# Patient Record
Sex: Male | Born: 1980 | Race: White | Hispanic: No | Marital: Married | Smoking: Former smoker
Health system: Southern US, Community
[De-identification: ages and names within clinical notes are randomized; demographics above are authoritative.]

## PROBLEM LIST (undated history)

## (undated) DIAGNOSIS — R011 Cardiac murmur, unspecified: Secondary | ICD-10-CM

## (undated) DIAGNOSIS — F1910 Other psychoactive substance abuse, uncomplicated: Secondary | ICD-10-CM

## (undated) DIAGNOSIS — F419 Anxiety disorder, unspecified: Secondary | ICD-10-CM

## (undated) HISTORY — DX: Other psychoactive substance abuse, uncomplicated: F19.10

## (undated) HISTORY — DX: Anxiety disorder, unspecified: F41.9

## (undated) HISTORY — DX: Cardiac murmur, unspecified: R01.1

## (undated) HISTORY — PX: FRACTURE SURGERY: SHX138

---

## 2004-05-12 ENCOUNTER — Emergency Department: Payer: Self-pay | Admitting: Emergency Medicine

## 2007-07-05 ENCOUNTER — Emergency Department: Payer: Self-pay | Admitting: Emergency Medicine

## 2008-12-08 ENCOUNTER — Emergency Department: Payer: Self-pay | Admitting: Emergency Medicine

## 2009-01-19 ENCOUNTER — Emergency Department: Payer: Self-pay | Admitting: Unknown Physician Specialty

## 2010-07-28 ENCOUNTER — Emergency Department: Payer: Self-pay | Admitting: Emergency Medicine

## 2011-04-06 ENCOUNTER — Ambulatory Visit: Payer: Self-pay

## 2011-04-06 DIAGNOSIS — F411 Generalized anxiety disorder: Secondary | ICD-10-CM

## 2011-09-10 ENCOUNTER — Other Ambulatory Visit: Payer: Self-pay

## 2011-09-10 NOTE — Telephone Encounter (Addendum)
Dr. Merla Riches   Patient states he has no money for an OV.  Would like a one month extension on celexa and Clonazepam [facility] [address] [facility] [place],  Pt called again today for  Same thing.   Pt called again to change his phone number please contact him at [phone]

## 2011-09-10 NOTE — Telephone Encounter (Signed)
Please pull chart for me to review 

## 2011-09-11 MED ORDER — CLONAZEPAM 1 MG PO TABS: ORAL_TABLET | ORAL | Status: DC

## 2011-09-11 MED ORDER — CITALOPRAM HYDROBROMIDE 40 MG PO TABS: 40.0000 mg | ORAL_TABLET | Freq: Every day | ORAL | Status: DC

## 2011-09-11 NOTE — Telephone Encounter (Signed)
1 month refill okay for her Celexa and Klonopin

## 2011-09-12 NOTE — Telephone Encounter (Signed)
Faxed Rxs to [facility] and notified pt that it was done.

## 2011-10-16 ENCOUNTER — Telehealth: Payer: Self-pay

## 2011-10-16 NOTE — Telephone Encounter (Signed)
Please pull paper chart.  

## 2011-10-16 NOTE — Telephone Encounter (Signed)
PT WOULD LIKE TO HAVE A REFILL ON CELEXA AND CLONAZEPAM/ PT STATES THAT HE IS AWARE THAT HE MAY NEED AN OFFICE VISIT BUT HE DOES NOT HAVE THE MONEY AT THIS TIME TO COME IN FOR AN OFFICE VISIT HE MAY BE ABLE TO COME IN A MONTH.

## 2011-10-17 ENCOUNTER — Other Ambulatory Visit: Payer: Self-pay | Admitting: Internal Medicine

## 2011-10-17 MED ORDER — CITALOPRAM HYDROBROMIDE 40 MG PO TABS: 40.0000 mg | ORAL_TABLET | Freq: Every day | ORAL | Status: DC

## 2011-10-17 MED ORDER — CLONAZEPAM 1 MG PO TABS: ORAL_TABLET | ORAL | Status: DC

## 2011-10-17 NOTE — Telephone Encounter (Signed)
Rx done and ready to be faxed for pharmacy - need to find out which one. Also, he will need an office visit for additional refills, but I gave him 1 month of each.

## 2011-10-17 NOTE — Telephone Encounter (Signed)
Patient's chart is at the nurses station in the phone message pile.  DOS 04/06/11

## 2011-10-17 NOTE — Telephone Encounter (Signed)
[facility] in [place] [facility] [address]. Notified Pt he needs an OV before any more refills. Faxed refills over to [facility] at [phone] [facility]

## 2011-11-06 ENCOUNTER — Ambulatory Visit: Payer: Self-pay

## 2011-11-06 ENCOUNTER — Ambulatory Visit: Payer: Self-pay | Admitting: Internal Medicine

## 2011-11-06 VITALS — BP 112/76 | HR 90 | Temp 97.7°F | Resp 18 | Ht 67.0 in | Wt 155.8 lb

## 2011-11-06 DIAGNOSIS — F329 Major depressive disorder, single episode, unspecified: Secondary | ICD-10-CM | POA: Insufficient documentation

## 2011-11-06 DIAGNOSIS — F1120 Opioid dependence, uncomplicated: Secondary | ICD-10-CM | POA: Insufficient documentation

## 2011-11-06 DIAGNOSIS — G4700 Insomnia, unspecified: Secondary | ICD-10-CM

## 2011-11-06 DIAGNOSIS — G894 Chronic pain syndrome: Secondary | ICD-10-CM

## 2011-11-06 DIAGNOSIS — F17200 Nicotine dependence, unspecified, uncomplicated: Secondary | ICD-10-CM | POA: Insufficient documentation

## 2011-11-06 DIAGNOSIS — F411 Generalized anxiety disorder: Secondary | ICD-10-CM

## 2011-11-06 MED ORDER — TRAZODONE HCL 50 MG PO TABS: 50.0000 mg | ORAL_TABLET | Freq: Every evening | ORAL | Status: DC | PRN

## 2011-11-06 MED ORDER — CLONAZEPAM 1 MG PO TABS: ORAL_TABLET | ORAL | Status: DC

## 2011-11-06 MED ORDER — CITALOPRAM HYDROBROMIDE 40 MG PO TABS: 40.0000 mg | ORAL_TABLET | Freq: Every day | ORAL | Status: DC

## 2011-11-06 NOTE — Progress Notes (Signed)
  Subjective:    Patient ID: Vernon Hudson, male    DOB: [birth year], 31 y.o.   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 Chronic pain syndrome  . Methadone maintenance therapy patient  . Depression  . GAD (generalized anxiety disorder)  . Nicotine addiction    -  Insomnia  Meds ordered this encounter  Medications  . methadone (DOLOPHINE) 10 MG/ML solution    Sig: Take by mouth every 8 (eight) hours.  . clonazePAM (KLONOPIN) 1 MG tablet    Sig: 1 in the morning, one in the afternoon, and 2 at bedtime. Needs office visit    Dispense:  120 tablet    Refill:  5  . citalopram (CELEXA) 40 MG tablet    Sig: Take 1 tablet (40 mg total) by mouth daily. Needs office visit    Dispense:  30 tablet    Refill:  5  . traZODone (DESYREL) 50 MG tablet    Sig: Take 1 tablet (50 mg total) by mouth at bedtime as needed for sleep.    Dispense:  30 tablet    Refill:  5  I explained that he is at his max for Klonopin/he understands the risk of respiratory depression and death from the combination with methadone He feels that anxiety is his biggest problem at this point with withdrawing from alcohol and/or methadone We discussed orthopedic help with chronic pain issues if they emerge as he withdraws from methadone As in the past he understands we will not treat  chronic pain for him Trazodone added to try to decrease his need for benzodiazepines at bedtime and perhaps to influence his chronic pain

## 2011-11-30 ENCOUNTER — Inpatient Hospital Stay: Payer: Self-pay | Admitting: Psychiatry

## 2011-11-30 LAB — URINALYSIS, COMPLETE
Bacteria: NONE SEEN
Ketone: NEGATIVE
Protein: NEGATIVE
RBC,UR: <1 /HPF (ref 0–5)
Specific Gravity: 1.005 (ref 1.003–1.030)
Squamous Epithelial: NONE SEEN
WBC UR: 2 /HPF (ref 0–5)

## 2011-11-30 LAB — CBC
HCT: 47.6 % (ref 40.0–52.0)
HGB: 16.1 g/dL (ref 13.0–18.0)
MCV: 94 fL (ref 80–100)
RBC: 5.07 10*6/uL (ref 4.40–5.90)
RDW: 12.7 % (ref 11.5–14.5)
WBC: 6.9 10*3/uL (ref 3.8–10.6)

## 2011-11-30 LAB — COMPREHENSIVE METABOLIC PANEL
Albumin: 4.1 g/dL (ref 3.4–5.0)
Anion Gap: 10 (ref 7–16)
Calcium, Total: 9.1 mg/dL (ref 8.5–10.1)
Co2: 28 mmol/L (ref 21–32)
Creatinine: 0.91 mg/dL (ref 0.60–1.30)
Glucose: 95 mg/dL (ref 65–99)
Osmolality: 276 (ref 275–301)
Potassium: 3.9 mmol/L (ref 3.5–5.1)
SGOT(AST): 92 U/L — ABNORMAL HIGH (ref 15–37)
Sodium: 138 mmol/L (ref 136–145)

## 2011-11-30 LAB — ETHANOL
Ethanol %: 0.063 % (ref 0.000–0.080)
Ethanol: 63 mg/dL

## 2011-11-30 LAB — DRUG SCREEN, URINE
Amphetamines, Ur Screen: NEGATIVE (ref ?–1000)
Benzodiazepine, Ur Scrn: NEGATIVE (ref ?–200)
MDMA (Ecstasy)Ur Screen: NEGATIVE (ref ?–500)
Phencyclidine (PCP) Ur S: NEGATIVE (ref ?–25)
Tricyclic, Ur Screen: NEGATIVE (ref ?–1000)

## 2011-11-30 LAB — ACETAMINOPHEN LEVEL: Acetaminophen: <2 ug/mL

## 2011-11-30 LAB — SALICYLATE LEVEL: Salicylates, Serum: 1.9 mg/dL

## 2012-02-09 ENCOUNTER — Telehealth: Payer: Self-pay

## 2012-02-09 NOTE — Telephone Encounter (Signed)
[facility]ed [facility] [address] and they had a RX on file and will fill for him. patient notified and voiced understanding.

## 2012-02-09 NOTE — Telephone Encounter (Signed)
Pt states that his pharmacy is stating that his prescription for celexa has no more refills and he is not due for a OV until January. Please Advise. [facility] [address] [phone] Best# [phone]

## 2012-02-09 NOTE — Telephone Encounter (Signed)
Please call the pharmacy.  When the patient was seen 7/30, he was given this medication with refills x 5.  He shouldn't be out until the end of January 2014.

## 2012-03-31 ENCOUNTER — Emergency Department: Payer: Self-pay | Admitting: Emergency Medicine

## 2012-03-31 LAB — COMPREHENSIVE METABOLIC PANEL
Albumin: 4.2 g/dL (ref 3.4–5.0)
Alkaline Phosphatase: 94 U/L (ref 50–136)
Anion Gap: 5 — ABNORMAL LOW (ref 7–16)
Calcium, Total: 9.1 mg/dL (ref 8.5–10.1)
Chloride: 101 mmol/L (ref 98–107)
Creatinine: 1.02 mg/dL (ref 0.60–1.30)
EGFR (African American): >60
Glucose: 107 mg/dL — ABNORMAL HIGH (ref 65–99)
Potassium: 3.8 mmol/L (ref 3.5–5.1)
SGOT(AST): 31 U/L (ref 15–37)
SGPT (ALT): 41 U/L (ref 12–78)

## 2012-03-31 LAB — CBC
HGB: 16.9 g/dL (ref 13.0–18.0)
MCV: 92 fL (ref 80–100)
Platelet: 241 10*3/uL (ref 150–440)
RBC: 5.34 10*6/uL (ref 4.40–5.90)
RDW: 12.8 % (ref 11.5–14.5)
WBC: 8.8 10*3/uL (ref 3.8–10.6)

## 2012-03-31 LAB — TROPONIN I: Troponin-I: <0.02 ng/mL

## 2012-04-09 ENCOUNTER — Ambulatory Visit: Payer: Self-pay | Admitting: Internal Medicine

## 2012-04-09 VITALS — BP 123/78 | HR 93 | Temp 97.9°F | Resp 17 | Ht 66.5 in | Wt 153.0 lb

## 2012-04-09 DIAGNOSIS — F17200 Nicotine dependence, unspecified, uncomplicated: Secondary | ICD-10-CM

## 2012-04-09 DIAGNOSIS — F329 Major depressive disorder, single episode, unspecified: Secondary | ICD-10-CM

## 2012-04-09 DIAGNOSIS — G894 Chronic pain syndrome: Secondary | ICD-10-CM

## 2012-04-09 DIAGNOSIS — F1120 Opioid dependence, uncomplicated: Secondary | ICD-10-CM

## 2012-04-09 DIAGNOSIS — F411 Generalized anxiety disorder: Secondary | ICD-10-CM

## 2012-04-09 MED ORDER — CLONAZEPAM 1 MG PO TABS: ORAL_TABLET | ORAL | Status: DC

## 2012-04-09 MED ORDER — ALPRAZOLAM 1 MG PO TABS: 1.0000 mg | ORAL_TABLET | Freq: Three times a day (TID) | ORAL | Status: AC | PRN

## 2012-04-09 MED ORDER — TRAZODONE HCL 100 MG PO TABS: 100.0000 mg | ORAL_TABLET | Freq: Every evening | ORAL | Status: DC | PRN

## 2012-04-09 MED ORDER — CITALOPRAM HYDROBROMIDE 40 MG PO TABS: 40.0000 mg | ORAL_TABLET | Freq: Every day | ORAL | Status: DC

## 2012-04-09 MED ORDER — HYDROXYZINE HCL 50 MG PO TABS: ORAL_TABLET | ORAL | Status: DC

## 2012-04-09 NOTE — Progress Notes (Signed)
  Subjective:    Patient ID: Nation Cradle, male    DOB: [birth year], 32 y.o.   MRN: [id number]  HPI recent anxiety increase [facility] 2 weeks ago-chest pain Dx-stress!!! 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 GAD (generalized anxiety disorder)   2. Methadone maintenance therapy patient   3. Depression   4. Chronic pain syndrome   5. Nicotine addiction   Continue Celexa/increase trazodone 200 at bedtime/LL 1 week of Xanax then resume Klonopin May add Vistaril at panic attack Continue followup with methadone maintenance Refer to psychology-[facility] [place] Recheck 6mos Meds ordered this encounter  Medications  . clonazePAM (KLONOPIN) 1 MG tablet    Sig: 1 in the morning, one in the afternoon, and 2 at bedtime.    Dispense:  120 tablet    Refill:  5  . citalopram (CELEXA) 40 MG tablet    Sig: Take 1 tablet (40 mg total) by mouth daily.    Dispense:  90 tablet    Refill:  1  . hydrOXYzine (ATARAX/VISTARIL) 50 MG tablet    Sig: One at onset at panic attack prn    Dispense:  30 tablet    Refill:  0  . traZODone (DESYREL) 100 MG tablet    Sig: Take 1 tablet (100 mg total) by mouth at bedtime as needed for sleep.    Dispense:  30 tablet    Refill:  5  . ALPRAZolam (XANAX) 1 MG tablet    Sig: Take 1 tablet (1 mg total) by mouth 3 (three) times daily as needed for sleep.    Dispense:  20 tablet    Refill:  0

## 2012-04-28 ENCOUNTER — Encounter: Payer: Self-pay | Admitting: Internal Medicine

## 2012-06-15 ENCOUNTER — Telehealth: Payer: Self-pay

## 2012-06-15 DIAGNOSIS — F411 Generalized anxiety disorder: Secondary | ICD-10-CM

## 2012-06-15 NOTE — Telephone Encounter (Signed)
Pt would like a refill on hydroxine.  Best# [phone] [facility] [address], [place]

## 2012-06-16 NOTE — Telephone Encounter (Signed)
Please advise on refill of hydroxyzine takes one at onset of panic attack. pended

## 2012-06-17 MED ORDER — HYDROXYZINE HCL 50 MG PO TABS: ORAL_TABLET | ORAL | Status: DC

## 2012-06-17 NOTE — Telephone Encounter (Signed)
Rx sent to pharmacy   

## 2012-07-04 ENCOUNTER — Encounter: Payer: Self-pay | Admitting: Internal Medicine

## 2012-07-07 ENCOUNTER — Telehealth: Payer: Self-pay | Admitting: Radiology

## 2012-07-07 NOTE — Telephone Encounter (Signed)
Faxed continuation of care form to [facility] with OV from 04/09/12 per Dr Merla Riches

## 2012-07-16 ENCOUNTER — Emergency Department: Payer: Self-pay | Admitting: Emergency Medicine

## 2012-09-20 ENCOUNTER — Other Ambulatory Visit: Payer: Self-pay | Admitting: Internal Medicine

## 2012-09-20 NOTE — Telephone Encounter (Signed)
Pt is calling because he is needing possible refills on 2 prescriptions he states he doesn't have the money to come in to be seen Call back number is [phone]

## 2012-09-21 NOTE — Telephone Encounter (Signed)
What about celexa and trazadone

## 2012-09-22 ENCOUNTER — Telehealth: Payer: Self-pay

## 2012-09-22 DIAGNOSIS — F411 Generalized anxiety disorder: Secondary | ICD-10-CM

## 2012-09-22 DIAGNOSIS — F329 Major depressive disorder, single episode, unspecified: Secondary | ICD-10-CM

## 2012-09-22 NOTE — Telephone Encounter (Signed)
Patient calling to speak to Dr. Merla Riches in regards to getting a month supply on his rx's because he cannot come in and afford an office visit.   Month supply on two of his rx's since he does not have any  celexa and klonozapam  [facility] in [place] [address] [phone]  Number: [phone]

## 2012-09-23 MED ORDER — CLONAZEPAM 1 MG PO TABS: ORAL_TABLET | ORAL | Status: DC

## 2012-09-23 MED ORDER — CITALOPRAM HYDROBROMIDE 40 MG PO TABS: 40.0000 mg | ORAL_TABLET | Freq: Every day | ORAL | Status: DC

## 2012-09-23 NOTE — Telephone Encounter (Signed)
Rx sent in

## 2012-09-23 NOTE — Telephone Encounter (Signed)
Meds ordered this encounter  Medications  . clonazePAM (KLONOPIN) 1 MG tablet    Sig: TAKE ONE TABLET BY MOUTH IN THE MORNING AND ONE IN THE AFTERNOON AND TWO AT BEDTIME    Dispense:  120 tablet    Refill:  0  . citalopram (CELEXA) 40 MG tablet    Sig: Take 1 tablet (40 mg total) by mouth daily.    Dispense:  90 tablet    Refill:  1

## 2012-10-17 ENCOUNTER — Ambulatory Visit (INDEPENDENT_AMBULATORY_CARE_PROVIDER_SITE_OTHER): Payer: Self-pay | Admitting: Physician Assistant

## 2012-10-17 VITALS — BP 110/72 | HR 90 | Temp 97.7°F | Resp 18 | Ht 67.5 in | Wt 159.0 lb

## 2012-10-17 DIAGNOSIS — F329 Major depressive disorder, single episode, unspecified: Secondary | ICD-10-CM

## 2012-10-17 DIAGNOSIS — F411 Generalized anxiety disorder: Secondary | ICD-10-CM

## 2012-10-17 DIAGNOSIS — F32A Depression, unspecified: Secondary | ICD-10-CM

## 2012-10-17 MED ORDER — CITALOPRAM HYDROBROMIDE 40 MG PO TABS: 40.0000 mg | ORAL_TABLET | Freq: Every day | ORAL | Status: DC

## 2012-10-17 MED ORDER — CLONAZEPAM 1 MG PO TABS: ORAL_TABLET | ORAL | Status: DC

## 2012-10-17 MED ORDER — HYDROXYZINE HCL 50 MG PO TABS: 50.0000 mg | ORAL_TABLET | Freq: Four times a day (QID) | ORAL | Status: DC | PRN

## 2012-10-17 MED ORDER — TRAZODONE HCL 100 MG PO TABS: 100.0000 mg | ORAL_TABLET | Freq: Every evening | ORAL | Status: DC | PRN

## 2012-10-17 NOTE — Progress Notes (Signed)
   [address] [place] [address]   Phone [phone]  Subjective:    Patient ID: Vernon Hudson, male    DOB: 06/20/1980, 32 y.o.   MRN: [id number]  HPI Pt presents to clinic for med refills.  He took today off to see Dr Merla Riches.  He had good results with the Atarax for the panic attacks but has not had any for about 6 months.  He notices he uses less Klonopin at night (only 1) when he takes the trazodone at bedtime.  He is out of his Celexa and has been using a friends prozac.  He has decreased his methadone to 100mg  and continues to plan to decrease the dosage through the clinic.   Review of Systems     Objective:   Physical Exam        Assessment & Plan:  GAD (generalized anxiety disorder) - Plan: citalopram (CELEXA) 40 MG tablet, clonazePAM (KLONOPIN) 1 MG tablet, hydrOXYzine (ATARAX/VISTARIL) 50 MG tablet, traZODone (DESYREL) 100 MG tablet  Depression - Plan: citalopram (CELEXA) 40 MG tablet, traZODone (DESYREL) 100 MG tablet  I d/w pt our controlled substance policy and gave him 1 month of med except for his Klonopin and I gave him 1 week supply and he will f/u with Dr Merla Riches on Sunday am.  I d/w pt that he should plan to only see Dr Merla Riches for this medication in the future.  Benny Lennert PA-C 10/17/2012 2:32 PM

## 2012-10-19 ENCOUNTER — Ambulatory Visit: Payer: Self-pay | Admitting: Internal Medicine

## 2012-10-19 VITALS — BP 120/80 | HR 69 | Temp 98.0°F | Resp 12 | Ht 67.0 in | Wt 156.0 lb

## 2012-10-19 DIAGNOSIS — F411 Generalized anxiety disorder: Secondary | ICD-10-CM

## 2012-10-19 DIAGNOSIS — F329 Major depressive disorder, single episode, unspecified: Secondary | ICD-10-CM

## 2012-10-19 MED ORDER — CLONAZEPAM 1 MG PO TABS: ORAL_TABLET | ORAL | Status: DC

## 2012-10-19 MED ORDER — TRAZODONE HCL 100 MG PO TABS: 100.0000 mg | ORAL_TABLET | Freq: Every evening | ORAL | Status: DC | PRN

## 2012-10-19 MED ORDER — CITALOPRAM HYDROBROMIDE 40 MG PO TABS: 40.0000 mg | ORAL_TABLET | Freq: Every day | ORAL | Status: DC

## 2012-10-19 MED ORDER — HYDROXYZINE HCL 50 MG PO TABS: 50.0000 mg | ORAL_TABLET | Freq: Four times a day (QID) | ORAL | Status: DC | PRN

## 2012-10-20 NOTE — Progress Notes (Signed)
  Subjective:    Patient ID: Vernon Hudson, male    DOB: 1980-11-22, 32 y.o.   MRN: [id number]  HPIsee last ov--is here for longer term refills Down to 40mg  methadone a day Life more stable Considering chg to subox if insurance from DSS comes thru dper anx stable No subst ab Sleeping well Vistaril not needed often(panic)  Review of Systems     Objective:   Physical Exam BP 120/80  Pulse 69  Temp(Src) 98 F (36.7 C) (Oral)  Resp 12  Ht 5\' 7"  (1.702 m)  Wt 156 lb (70.761 kg)  BMI 24.43 kg/m2 Mood good Affect appr       Assessment & Plan:  GAD (generalized anxiety disorder) - Plan: hydrOXYzine (ATARAX/VISTARIL) 50 MG tablet, clonazePAM (KLONOPIN) 1 MG tablet, citalopram (CELEXA) 40 MG tablet, traZODone (DESYREL) 100 MG tablet  Depression - Plan: citalopram (CELEXA) 40 MG tablet, traZODone (DESYREL) 100 MG tablet  Meds ordered this encounter  Medications  . hydrOXYzine (ATARAX/VISTARIL) 50 MG tablet    Sig: Take 1 tablet (50 mg total) by mouth every 6 (six) hours as needed (anxiety).    Dispense:  120 tablet    Refill:  5    Order Specific Question:  Supervising Provider    Answer:  Keevon Henney P [[id number]]  . clonazePAM (KLONOPIN) 1 MG tablet    Sig: TAKE ONE TABLET BY MOUTH IN THE MORNING AND ONE IN THE AFTERNOON AND TWO AT BEDTIME    Dispense:  120 tablet    Refill:  5    Order Specific Question:  Supervising Provider    Answer:  Dariane Natzke P [[id number]]  . citalopram (CELEXA) 40 MG tablet    Sig: Take 1 tablet (40 mg total) by mouth daily.    Dispense:  30 tablet    Refill:  5    Order Specific Question:  Supervising Provider    Answer:  Zakhia Seres P [[id number]]  . traZODone (DESYREL) 100 MG tablet    Sig: Take 1 tablet (100 mg total) by mouth at bedtime as needed for sleep.    Dispense:  30 tablet    Refill:  5    Order Specific Question:  Supervising Provider    Answer:  Pedram Goodchild P [[id number]]   F/u 6 mos

## 2012-10-24 ENCOUNTER — Emergency Department: Payer: Self-pay | Admitting: Emergency Medicine

## 2012-12-25 ENCOUNTER — Emergency Department: Payer: Self-pay | Admitting: Emergency Medicine

## 2013-01-05 ENCOUNTER — Emergency Department: Payer: Self-pay | Admitting: Emergency Medicine

## 2013-03-03 ENCOUNTER — Other Ambulatory Visit: Payer: Self-pay | Admitting: Internal Medicine

## 2013-03-03 ENCOUNTER — Telehealth: Payer: Self-pay

## 2013-03-03 NOTE — Telephone Encounter (Signed)
Called pharm who reported that the pt has no RFs on file. I gave him 2 RFs w/ note that pt will be due for f/up before they run out. Asked pharm about whether his ins will pay for them yet and was advised that they only cost $4 to pay OOP. Tried to call pt but his VM was full.

## 2013-03-03 NOTE — Telephone Encounter (Signed)
Was medication sent for pt?  Telephone note dated 11/25 sounds like this was sent but I do not see it on the med list as being sent

## 2013-03-03 NOTE — Telephone Encounter (Signed)
Pt states he lost his celexa,has been out of for three days,pt says he is panicky.  Best phone for pt is [phone]  [facility] [address]

## 2013-03-04 NOTE — Telephone Encounter (Signed)
Called pt back and notified pt.

## 2013-04-16 ENCOUNTER — Ambulatory Visit: Payer: Self-pay | Admitting: Emergency Medicine

## 2013-04-16 ENCOUNTER — Other Ambulatory Visit: Payer: Self-pay | Admitting: Internal Medicine

## 2013-04-16 VITALS — BP 120/72 | HR 76 | Temp 98.3°F | Resp 16 | Ht 66.5 in | Wt 154.4 lb

## 2013-04-16 DIAGNOSIS — F411 Generalized anxiety disorder: Secondary | ICD-10-CM

## 2013-04-16 DIAGNOSIS — F3289 Other specified depressive episodes: Secondary | ICD-10-CM

## 2013-04-16 DIAGNOSIS — F329 Major depressive disorder, single episode, unspecified: Secondary | ICD-10-CM

## 2013-04-16 DIAGNOSIS — F32A Depression, unspecified: Secondary | ICD-10-CM

## 2013-04-16 MED ORDER — CITALOPRAM HYDROBROMIDE 40 MG PO TABS: ORAL_TABLET | ORAL | Status: DC

## 2013-04-16 MED ORDER — CLONAZEPAM 1 MG PO TABS: ORAL_TABLET | ORAL | Status: DC

## 2013-04-16 MED ORDER — TRAZODONE HCL 100 MG PO TABS: 100.0000 mg | ORAL_TABLET | Freq: Every evening | ORAL | Status: DC | PRN

## 2013-04-16 NOTE — Progress Notes (Signed)
[facility] [address], [place] [address] [phone]  Date:  04/16/2013   Name:  Vernon ShropshireDavid Hudson   DOB:  Nov 14, 1980   MRN:  [id number]  PCP:  Tonye PearsonOLITTLE, ROBERT P, MD    Chief Complaint: Medication Refill   History of Present Illness:  Vernon Hudson is a 33 y.o. very pleasant male patient who presents with the following:  History of methadone treatment and anxiety.  Here for semi annual refills of medication.  Works in [employer].  Eating ok.  Sleeping ok. Not regularly using trazadone   Patient Active Problem List   Diagnosis Date Noted  . Chronic pain syndrome 11/06/2011  . Methadone maintenance therapy patient 11/06/2011  . Depression 11/06/2011  . GAD (generalized anxiety disorder) 11/06/2011  . Nicotine addiction 11/06/2011    Past Medical History  Diagnosis Date  . Anxiety   . Substance abuse   . Heart murmur     Past Surgical History  Procedure Laterality Date  . Fracture surgery      History  Substance Use Topics  . Smoking status: Current Every Day Smoker -- 0.40 packs/day for 12 years    Types: Cigarettes  . Smokeless tobacco: Former NeurosurgeonUser    Types: Snuff  . Alcohol Use: No    Family History  Problem Relation Age of Onset  . Heart disease Mother   . Heart disease Father     No Known Allergies  Medication list has been reviewed and updated.  Current Outpatient Prescriptions on File Prior to Visit  Medication Sig Dispense Refill  . citalopram (CELEXA) 40 MG tablet TAKE ONE TABLET BY MOUTH ONCE DAILY  30 tablet  1  . clonazePAM (KLONOPIN) 1 MG tablet TAKE ONE TABLET BY MOUTH IN THE MORNING AND ONE IN THE AFTERNOON AND TWO AT BEDTIME  120 tablet  5  . hydrOXYzine (ATARAX/VISTARIL) 50 MG tablet Take 1 tablet (50 mg total) by mouth every 6 (six) hours as needed (anxiety).  120 tablet  5  . methadone (DOLOPHINE) 10 MG/ML solution Take 40 mg by mouth daily.       . traZODone (DESYREL) 100 MG tablet Take 1 tablet (100 mg total) by  mouth at bedtime as needed for sleep.  30 tablet  5   No current facility-administered medications on file prior to visit.    Review of Systems:  As per HPI, otherwise negative.    Physical Examination: Filed Vitals:   04/16/13 1344  BP: 120/72  Pulse: 76  Temp: 98.3 F (36.8 C)  Resp: 16   Filed Vitals:   04/16/13 1344  Height: 5' 6.5" (1.689 m)  Weight: 154 lb 6.4 oz (70.035 kg)   Body mass index is 24.55 kg/(m^2). Ideal Body Weight: Weight in (lb) to have BMI = 25: 156.9  GEN: WDWN, NAD, Non-toxic, A & O x 3 HEENT: Atraumatic, Normocephalic. Neck supple. No masses, No LAD. Ears and Nose: No external deformity. CV: RRR, No M/G/R. No JVD. No thrill. No extra heart sounds. PULM: CTA B, no wheezes, crackles, rhonchi. No retractions. No resp. distress. No accessory muscle use. ABD: S, NT, ND, +BS. No rebound. No HSM. EXTR: No c/c/e NEURO Normal gait.  PSYCH: Normally interactive. Conversant. Not depressed or anxious appearing.  Calm demeanor.    Assessment and Plan: Anxiety Chronic pain Refills Reminded patient of controlled drug policy   Signed,  Phillips OdorJeffery Shaneen Reeser, MD

## 2013-04-16 NOTE — Patient Instructions (Signed)
Ataques de Ansiedade e Pânico  (Anxiety and Panic Attacks)  O médico informou que você está tendo um ataque de ansiedade ou de pânico, que pode se apresentar de várias formas. Na maioria das vezes, o início desses ataques é espontâneo. Eles ocorrem a qualquer hora do dia, incluindo os períodos do sono e a qualquer momento da vida. São repentinos, fortes e inexplicados. Embora os ataques de pânico sejam bem assustadores, eles são fisicamente inofensivos. Por vezes, a causa de sua ansiedade é desconhecida. A ansiedade é um mecanismo protetor do corpo em seu mecanismo da luta ou do voo.  A maior parte destas situações de perigo visíveis são, na verdade, situações não físicas (como por exemplo, a ansiedade por perder o trabalho).  CAUSAS  As causas da ansiedade ou ataques de pânico são várias. Os ataques de pânico ocorrem em pessoas saudáveis em certas circunstâncias. Pode haver uma causa genética para os ataques de pânico. A ansiedade pode também ser um efeito secundário de alguns medicamentos.  SINTOMAS  Alguns dos sentimentos recorrentes mais comuns são:  · Intenso terror.  · Palpitações e dor no peito.  · Tontura, sentir que vai desmaiar.  · Sensações de sufocamento e asfixia.  · Sensações de quente e frio.  · Sentimento de uma desgraça iminente e que a morte está próxima.  · Medo de ficar louco(a).  · Formigamento nas extremidades.  · Sentimentos de que nada é real.  · Suor.  · Tremores, estremecimentos.  · Realidade alterada (desrealização).  · Ruptura com a personalidade (despersonalização).  Todos esses sintomas mais comuns e outros podem ser combinados para criar ataques de pânico.  DIAGNÓSTICO  A avaliação do médico depende do tipo de sintomas que o paciente experimenta. O diagnóstico de ansiedade ou ataque de pânico é feito quando não é possível determinar uma doença física como causa dos sintomas.  TRATAMENTO  O tratamento para prevenir a ansiedade e os ataques de pânico podem incluir:  · Fuga de  circunstâncias que causam ansiedade.  · Tranquilização e relaxamento.  · Prática regular de exercícios físicos.  · Terapias de relaxamento, como ioga.  · Psicoterapia com um psiquiatra ou terapeuta.  · Evasão à cafeína, álcool e drogas ilegais.  · Medicamentos vendidos com receita médica.  PROCURE UM MÉDICO IMEDIATAMENTE SE:  · Se experimentar sintomas de ataque de pânico diferentes dos sintomas habituais.  · Tiver sintomas piorando ou preocupantes.  Document Released: 03/26/2005 Document Revised: 06/18/2011  ExitCare® Patient Information ©2014 [employer].

## 2013-04-23 ENCOUNTER — Emergency Department: Payer: Self-pay | Admitting: Emergency Medicine

## 2013-04-23 LAB — RAPID INFLUENZA A&B ANTIGENS (ARMC ONLY)

## 2013-04-25 LAB — BETA STREP CULTURE(ARMC)

## 2013-05-05 ENCOUNTER — Other Ambulatory Visit: Payer: Self-pay | Admitting: Emergency Medicine

## 2013-05-06 NOTE — Telephone Encounter (Signed)
citalopram (CELEXA) 40 MG tablet [facility]  [place] [place] [address]   Patient would like five months of this so he does not have to call each month.   [phone]

## 2013-08-06 ENCOUNTER — Telehealth: Payer: Self-pay

## 2013-08-06 NOTE — Telephone Encounter (Signed)
Reviewed note and medication was discontinued and reordered.  Is it ok to resend to pharmacy for patient.

## 2013-08-06 NOTE — Telephone Encounter (Signed)
Phone call to patient. Advised that we are [facility]

## 2013-08-06 NOTE — Telephone Encounter (Signed)
RX called in .

## 2013-08-06 NOTE — Telephone Encounter (Signed)
Ok. Thanks!

## 2013-08-06 NOTE — Telephone Encounter (Signed)
Patient lost his citalopram (CELEXA) 40 MG tablet  He has searched for three days and is at wits end.    [facility] [address]   [phone]

## 2013-08-30 ENCOUNTER — Telehealth: Payer: Self-pay

## 2013-08-30 NOTE — Telephone Encounter (Signed)
Patient is out of rx celexa. He would like a refill. Please call if he needs to RTC or not. He received a 30 day supply last time in [place]. 2015

## 2013-08-30 NOTE — Telephone Encounter (Signed)
08/06/13 celexa was called in but was not documented if any additional refills given. Called pharmacy and no additional were called in. Went ahead and refilled # 30 today. Would you like to add additional refills or have him RTC for recheck with you? Please advise

## 2013-08-31 NOTE — Telephone Encounter (Signed)
Was given a six-month supply by Dr. Dareen Piano in January and so should followup with [place] in July

## 2013-09-08 NOTE — Telephone Encounter (Signed)
Patient notified and voiced understanding.

## 2013-09-26 ENCOUNTER — Ambulatory Visit (INDEPENDENT_AMBULATORY_CARE_PROVIDER_SITE_OTHER): Payer: Self-pay | Admitting: Emergency Medicine

## 2013-09-26 VITALS — BP 110/68 | HR 70 | Temp 97.7°F | Resp 14 | Ht 67.0 in | Wt 150.2 lb

## 2013-09-26 DIAGNOSIS — F32A Depression, unspecified: Secondary | ICD-10-CM

## 2013-09-26 DIAGNOSIS — F3289 Other specified depressive episodes: Secondary | ICD-10-CM

## 2013-09-26 DIAGNOSIS — G894 Chronic pain syndrome: Secondary | ICD-10-CM

## 2013-09-26 DIAGNOSIS — F411 Generalized anxiety disorder: Secondary | ICD-10-CM

## 2013-09-26 DIAGNOSIS — F329 Major depressive disorder, single episode, unspecified: Secondary | ICD-10-CM

## 2013-09-26 MED ORDER — CITALOPRAM HYDROBROMIDE 40 MG PO TABS: ORAL_TABLET | ORAL | Status: DC

## 2013-09-26 MED ORDER — CLONAZEPAM 1 MG PO TABS: ORAL_TABLET | ORAL | Status: DC

## 2013-09-26 MED ORDER — TRAZODONE HCL 100 MG PO TABS: 100.0000 mg | ORAL_TABLET | Freq: Every evening | ORAL | Status: DC | PRN

## 2013-09-26 NOTE — Progress Notes (Signed)
[facility] [address], [place] [address] [phone]  Date:  09/26/2013   Name:  Vernon ShropshireDavid Hudson   DOB:  12-20-1980   MRN:  [id number]  PCP:  Tonye PearsonOLITTLE, ROBERT P, MD    Chief Complaint: Medication Refill   History of Present Illness:  Vernon ShropshireDavid Hudson is a 33 y.o. very pleasant male patient who presents with the following:  Patient has long history of anxiety, chronic pain, and narcotic addiction.  He has come in today for refills on his medications.  Expressed a desire in cutting back the dose of clonidine in the future.  No improvement with over the counter medications or other home remedies. Denies other complaint or health concern today.   Patient Active Problem List   Diagnosis Date Noted  . Chronic pain syndrome 11/06/2011  . Methadone maintenance therapy patient 11/06/2011  . Depression 11/06/2011  . GAD (generalized anxiety disorder) 11/06/2011  . Nicotine addiction 11/06/2011    Past Medical History  Diagnosis Date  . Anxiety   . Substance abuse   . Heart murmur     Past Surgical History  Procedure Laterality Date  . Fracture surgery      History  Substance Use Topics  . Smoking status: Current Every Day Smoker -- 0.40 packs/day for 12 years    Types: Cigarettes  . Smokeless tobacco: Former NeurosurgeonUser    Types: Snuff  . Alcohol Use: No    Family History  Problem Relation Age of Onset  . Heart disease Mother   . Heart disease Father     No Known Allergies  Medication list has been reviewed and updated.  Current Outpatient Prescriptions on File Prior to Visit  Medication Sig Dispense Refill  . citalopram (CELEXA) 40 MG tablet TAKE ONE TABLET BY MOUTH ONCE DAILY  30 tablet  5  . clonazePAM (KLONOPIN) 1 MG tablet TAKE ONE TABLET BY MOUTH IN THE MORNING AND ONE IN THE AFTERNOON AND TWO AT BEDTIME  120 tablet  5  . traZODone (DESYREL) 100 MG tablet Take 1 tablet (100 mg total) by mouth at bedtime as needed for sleep.  30 tablet  5  .  hydrOXYzine (ATARAX/VISTARIL) 50 MG tablet Take 1 tablet (50 mg total) by mouth every 6 (six) hours as needed (anxiety).  120 tablet  5  . methadone (DOLOPHINE) 10 MG/ML solution Take 40 mg by mouth daily.        No current facility-administered medications on file prior to visit.    Review of Systems:  As per HPI, otherwise negative.    Physical Examination: Filed Vitals:   09/26/13 0958  BP: 110/68  Pulse: 70  Temp: 97.7 F (36.5 C)  Resp: 14   Filed Vitals:   09/26/13 0958  Height: 5\' 7"  (1.702 m)  Weight: 150 lb 3.2 oz (68.13 kg)   Body mass index is 23.52 kg/(m^2). Ideal Body Weight: Weight in (lb) to have BMI = 25: 159.3   GEN: WDWN, NAD, Non-toxic, Alert & Oriented x 3 HEENT: Atraumatic, Normocephalic.  Ears and Nose: No external deformity. EXTR: No clubbing/cyanosis/edema NEURO: Normal gait.  PSYCH: Normally interactive. Conversant. Not depressed or anxious appearing.  Calm demeanor.    Assessment and Plan: Chronic anxiety Substance abuse history  Signed,  Phillips OdorJeffery Christopherjame Carnell, MD

## 2013-10-22 ENCOUNTER — Telehealth: Payer: Self-pay

## 2013-10-22 NOTE — Telephone Encounter (Signed)
[facility] CALLED AND WANTS PROVIDER TO KNOW THAT THIS PT IS ON SUBOXONE AND IS CONCERNED ABOUT HOW THIS INTERACTS WITH WHAT WE HAVE PRESCRIBED HIM. PLEASE CALL IF ANY QUESTIONS OR CONCERNS.

## 2014-01-29 ENCOUNTER — Telehealth: Payer: Self-pay

## 2014-01-29 NOTE — Telephone Encounter (Signed)
Patient is missing two weeks of citalopram (CELEXA) 40 MG tablet [facility] [place]  He moved, his daughter did something she should not have done.  Needs a refill now.   [phone]

## 2014-02-01 NOTE — Telephone Encounter (Signed)
Pt was able to get his refill at the pharmacy.  He was transferred to schedule an appt.

## 2014-02-25 ENCOUNTER — Telehealth: Payer: Self-pay

## 2014-02-25 NOTE — Telephone Encounter (Signed)
Patient called to inquire about balance and Dr. Netta Corriganoolittle's schedule. Advised patient of balance and the doctors schedule.

## 2014-02-28 ENCOUNTER — Ambulatory Visit (INDEPENDENT_AMBULATORY_CARE_PROVIDER_SITE_OTHER): Payer: Self-pay | Admitting: Internal Medicine

## 2014-02-28 VITALS — BP 116/74 | HR 89 | Temp 97.9°F | Resp 18 | Ht 67.0 in | Wt 150.0 lb

## 2014-02-28 DIAGNOSIS — F329 Major depressive disorder, single episode, unspecified: Secondary | ICD-10-CM

## 2014-02-28 DIAGNOSIS — F411 Generalized anxiety disorder: Secondary | ICD-10-CM

## 2014-02-28 DIAGNOSIS — F32A Depression, unspecified: Secondary | ICD-10-CM

## 2014-02-28 MED ORDER — CITALOPRAM HYDROBROMIDE 40 MG PO TABS: ORAL_TABLET | ORAL | Status: DC

## 2014-02-28 MED ORDER — TRAZODONE HCL 100 MG PO TABS: 100.0000 mg | ORAL_TABLET | Freq: Every evening | ORAL | Status: DC | PRN

## 2014-02-28 MED ORDER — CLONAZEPAM 1 MG PO TABS: ORAL_TABLET | ORAL | Status: DC

## 2014-02-28 NOTE — Progress Notes (Signed)
Subjective:    Patient ID: Vernon Hudson, male    DOB: September 17, 1980, 33 y.o.   MRN: [id number]  HPI Chief Complaint  Patient presents with  . Medication Refill    Celexa, Klonopin, and Trazodone   This chart was scribed for Ellamae Siaobert Alyzabeth Pontillo, MD by Andrew Auaven Small, ED Scribe. This patient was seen in room 11 and the patient's care was started at 10:13 AM.  HPI Comments: Vernon ShropshireDavid Pieratt is a 33 y.o. male who presents to the [facility] for a medication refill. Pt states he has been doing well. 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 Pt had medication change from methadone to suboxone. He states he is currently taking 1 mg of suboxone daily but is wanting to take .5 mg daily. Pt has gone from 80 mg of methadone to .5 mg of suboxone. Pt states he is happy with the medication change and feels much better. Pt reports tolerance with all other medication including trazodone, which he takes at bed time, klonopin, which he takes 4 times daily and celexa. Pt is hoping to ween off all medication in the future.   Past Medical History  Diagnosis Date  . Anxiety   . Substance abuse   . Heart murmur    No Known Allergies Prior to Admission medications   Medication Sig Start Date End Date Taking? Authorizing Provider  buprenorphine-naloxone (SUBOXONE) 8-2 MG SUBL SL tablet Place 1 tablet under the tongue daily.   Yes Historical Provider, MD  citalopram (CELEXA) 40 MG tablet TAKE ONE TABLET BY MOUTH ONCE DAILY 09/26/13  Yes Carmelina DaneJeffery S Anderson, MD  clonazePAM (KLONOPIN) 1 MG tablet TAKE ONE TABLET BY MOUTH IN THE MORNING AND ONE IN THE AFTERNOON AND TWO AT BEDTIME 09/26/13  Yes Carmelina DaneJeffery S Anderson, MD  traZODone (DESYREL) 100 MG tablet Take 1 tablet (100 mg total) by mouth at bedtime as needed for sleep. 09/26/13 03/08/14 Yes Carmelina DaneJeffery S Anderson, MD   Review of Systems     Objective:   Physical Exam  Constitutional: He is oriented to person, place, and time. He appears well-developed  and well-nourished. No distress.  HENT:  Head: Normocephalic and atraumatic.  Eyes: Conjunctivae and EOM are normal.  Neck: Neck supple.  Cardiovascular: Normal rate.   Pulmonary/Chest: Effort normal.  Musculoskeletal: Normal range of motion.  Neurological: He is alert and oriented to person, place, and time.  Skin: Skin is warm and dry.  Psychiatric: He has a normal mood and affect. His behavior is normal.  Nursing note and vitals reviewed.   Assessment & Plan:   I personally performed the services described in this documentation, which was scribed in my presence. The recorded information has been reviewed and is accurate.  I have completed the patient encounter in its entirety as documented by the scribe, with editing by me where necessary. Yasmene Salomone P. Merla Richesoolittle, M.D.  GAD (generalized anxiety disorder) - Plan: traZODone (DESYREL) 100 MG tablet, clonazePAM (KLONOPIN) 1 MG tablet  Depression - Plan: traZODone (DESYREL) 100 MG tablet  Meds ordered this encounter  Medications  . SUBOXONE 4-1 MG FILM    Sig:     Refill:  0  . citalopram (CELEXA) 40 MG tablet    Sig: TAKE ONE TABLET BY MOUTH ONCE DAILY    Dispense:  90 tablet    Refill:  3  . traZODone (DESYREL) 100 MG tablet    Sig: Take 1 tablet (100 mg total) by mouth at bedtime  as needed for sleep.    Dispense:  30 tablet    Refill:  5  . clonazePAM (KLONOPIN) 1 MG tablet    Sig: TAKE ONE TABLET BY MOUTH IN THE MORNING AND ONE IN THE AFTERNOON AND TWO AT BEDTIME    Dispense:  120 tablet    Refill:  5   F/u 6mos
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
## 2014-08-16 ENCOUNTER — Ambulatory Visit (INDEPENDENT_AMBULATORY_CARE_PROVIDER_SITE_OTHER): Payer: Self-pay | Admitting: Internal Medicine

## 2014-08-16 VITALS — BP 122/82 | HR 85 | Temp 98.4°F | Resp 16 | Ht 66.75 in | Wt 154.6 lb

## 2014-08-16 DIAGNOSIS — F411 Generalized anxiety disorder: Secondary | ICD-10-CM

## 2014-08-16 DIAGNOSIS — F32A Depression, unspecified: Secondary | ICD-10-CM

## 2014-08-16 DIAGNOSIS — F329 Major depressive disorder, single episode, unspecified: Secondary | ICD-10-CM

## 2014-08-16 MED ORDER — CLONAZEPAM 1 MG PO TABS: ORAL_TABLET | ORAL | Status: DC

## 2014-08-16 MED ORDER — TRAZODONE HCL 100 MG PO TABS: 100.0000 mg | ORAL_TABLET | Freq: Every evening | ORAL | Status: DC | PRN

## 2014-08-16 MED ORDER — SERTRALINE HCL 100 MG PO TABS: 150.0000 mg | ORAL_TABLET | Freq: Every day | ORAL | Status: DC

## 2014-08-16 NOTE — Progress Notes (Signed)
   Subjective:  This chart was scribed for Vernon Siaobert Doolittle, MD by Stann Oresung-Kai Tsai, Medical Scribe. This patient was seen in Room 1 and the patient's care was started at 3:59 PM.     Patient ID: Vernon Hudson, male    DOB: 1981-01-04, 34 y.o.   MRN: [id number]  HPI Vernon Vernon Hudson is a 34 y.o. male who presents to [facility] for follow up for medication refill. Patient reports waking up with panic. He believes that Celexa isn't as effective anymore. He notes taking Suboxone and since last office visit remains at 1 mg.   He also states taking trazodone and klonopin at bed time, and Klonopin during the day. He wants to come down from klonopin but feels like his anxiety is too significant to do so at this point.  Past treatment with prozac but was too irritable. He denies taking other anxiety drugs.   His upcoming job change may let him have insurance    Review of Systems   noncontributory     Objective:   Physical Exam  Constitutional: He is oriented to person, place, and time. He appears well-developed and well-nourished. No distress.  HENT:  Head: Normocephalic and atraumatic.  Mouth/Throat: Oropharynx is clear and moist. No oropharyngeal exudate.  Eyes: Pupils are equal, round, and reactive to light.  Neck: Neck supple.  Cardiovascular: Normal rate.   Pulmonary/Chest: Effort normal.  Musculoskeletal: He exhibits no edema.  Neurological: He is alert and oriented to person, place, and time. No cranial nerve deficit.  Skin: Skin is dry. No rash noted.  Psychiatric: He has a normal mood and affect. His behavior is normal.  Nursing note and vitals reviewed. BP 122/82 mmHg  Pulse 85  Temp(Src) 98.4 F (36.9 C) (Oral)  Resp 16  Ht 5' 6.75" (1.695 m)  Wt 154 lb 9.6 oz (70.126 kg)  BMI 24.41 kg/m2  SpO2 98%         Assessment & Plan:  GAD (generalized anxiety disorder) - Plan: clonazePAM (KLONOPIN) 1 MG tablet, traZODone (DESYREL) 100 MG tablet  Depression - Plan: traZODone  (DESYREL) 100 MG tablet  Meds ordered this encounter  Medications  . sertraline (ZOLOFT) 100 MG tablet------------------ replace Celexa     Sig: Take 1.5 tablets (150 mg total) by mouth daily.    Dispense:  45 tablet    Refill:  11  . clonazePAM (KLONOPIN) 1 MG tablet    Sig: TAKE ONE TABLET BY MOUTH IN THE MORNING AND ONE IN THE AFTERNOON AND TWO AT BEDTIME    Dispense:  120 tablet    Refill:  5  . traZODone (DESYREL) 100 MG tablet    Sig: Take 1 tablet (100 mg total) by mouth at bedtime as needed for sleep.    Dispense:  30 tablet    Refill:  5   Call with results in 6-8 weeks and if necessary will increase to 200  Zoloft I have completed the patient encounter in its entirety as documented by the scribe, with editing by me where necessary. Robert P. Merla Richesoolittle, M.D.

## 2014-09-10 ENCOUNTER — Emergency Department: Payer: Self-pay

## 2014-09-10 ENCOUNTER — Other Ambulatory Visit: Payer: Self-pay

## 2014-09-10 ENCOUNTER — Encounter: Payer: Self-pay | Admitting: Emergency Medicine

## 2014-09-10 ENCOUNTER — Emergency Department
Admission: EM | Admit: 2014-09-10 | Discharge: 2014-09-10 | Disposition: A | Discharge status: Home / Self Care | Payer: Self-pay | Attending: Emergency Medicine | Admitting: Emergency Medicine

## 2014-09-10 DIAGNOSIS — Z79899 Other long term (current) drug therapy: Secondary | ICD-10-CM | POA: Insufficient documentation

## 2014-09-10 DIAGNOSIS — R079 Chest pain, unspecified: Secondary | ICD-10-CM | POA: Insufficient documentation

## 2014-09-10 DIAGNOSIS — R0789 Other chest pain: Secondary | ICD-10-CM

## 2014-09-10 DIAGNOSIS — Z720 Tobacco use: Secondary | ICD-10-CM | POA: Insufficient documentation

## 2014-09-10 LAB — CBC
HCT: 51.8 % (ref 40.0–52.0)
Hemoglobin: 17.2 g/dL (ref 13.0–18.0)
MCH: 30.9 pg (ref 26.0–34.0)
MCHC: 33.2 g/dL (ref 32.0–36.0)
MCV: 93.1 fL (ref 80.0–100.0)
Platelets: 237 10*3/uL (ref 150–440)
RBC: 5.57 MIL/uL (ref 4.40–5.90)
RDW: 12.7 % (ref 11.5–14.5)
WBC: 8.1 10*3/uL (ref 3.8–10.6)

## 2014-09-10 LAB — BASIC METABOLIC PANEL
Anion gap: 9 (ref 5–15)
BUN: 13 mg/dL (ref 6–20)
CALCIUM: 8.9 mg/dL (ref 8.9–10.3)
CO2: 28 mmol/L (ref 22–32)
Chloride: 101 mmol/L (ref 101–111)
Creatinine, Ser: 1.02 mg/dL (ref 0.61–1.24)
GFR calc Af Amer: >60 mL/min (ref >60)
GLUCOSE: 132 mg/dL — AB (ref 65–99)
Potassium: 4.5 mmol/L (ref 3.5–5.1)
Sodium: 138 mmol/L (ref 135–145)

## 2014-09-10 LAB — TROPONIN I: Troponin I: <0.03 ng/mL (ref <0.031)

## 2014-09-10 MED ORDER — KETOROLAC TROMETHAMINE 10 MG PO TABS
20.0000 mg | ORAL_TABLET | Freq: Once | ORAL | Status: AC
  Administered 2014-09-10: 20 mg via ORAL

## 2014-09-10 MED ORDER — KETOROLAC TROMETHAMINE 10 MG PO TABS
ORAL_TABLET | ORAL | Status: AC
  Filled 2014-09-10: qty 2

## 2014-09-10 NOTE — ED Notes (Signed)
Pt states he has had chest pain since yesterday; states he had pain yesterday morning that went away and came back last night worse than before. States the pain woke him up this morning at 4AM and has been constant since then. States the pain is dull and achy with episodes of squeezing pain. Pt also states that he has had similar cp in past, but he did not seek treatment (6 months ago)

## 2014-09-10 NOTE — ED Notes (Addendum)
Pt to ED with c/o intermittent midsternal chest pain since yesterday, states pain is worse with inspiration, pt states he has pulled multiple ticks off recently

## 2014-09-10 NOTE — ED Provider Notes (Signed)
[facility] Emergency Department Provider Note ____________________________________________  Time seen: 1:18 PM on 09/10/2014 ----------------------------------------- I have reviewed the triage vital signs and the nursing notes.  HISTORY  Chief Complaint Chest Pain  HPI Vernon Hudson is a 34 y.o. male who reports to the ED with intermittent substernal chest pain since yesterday. He noted the pain initially yesterday morning, it dissipated, then returned last night with increase intensity. He states he awoke this morning at about 4 AM with the same pain, and it has been constant since that time. He describes the pain as dull and aching with episodes of squeezing or tightness. He reports this chest pain discomfort is similar to him, noting that he had episode about 6 months prior, but did not seek treatment. He describes the pain is intensified by taking deep breaths.he rates the pain at a 5 out of 6 currently.  Past Medical History  Diagnosis Date  . Anxiety   . Substance abuse   . Heart murmur     Patient Active Problem List   Diagnosis Date Noted  . Chronic pain syndrome 11/06/2011  . Methadone maintenance therapy patient 11/06/2011  . Depression 11/06/2011  . GAD (generalized anxiety disorder) 11/06/2011  . Nicotine addiction 11/06/2011    Past Surgical History  Procedure Laterality Date  . Fracture surgery      Current Outpatient Rx  Name  Route  Sig  Dispense  Refill  . clonazePAM (KLONOPIN) 1 MG tablet      TAKE ONE TABLET BY MOUTH IN THE MORNING AND ONE IN THE AFTERNOON AND TWO AT BEDTIME   120 tablet   5   . sertraline (ZOLOFT) 100 MG tablet   Oral   Take 1.5 tablets (150 mg total) by mouth daily.   45 tablet   11   . SUBOXONE 4-1 MG FILM            0   . traZODone (DESYREL) 100 MG tablet   Oral   Take 1 tablet (100 mg total) by mouth at bedtime as needed for sleep.   30 tablet   5    Allergies Review of patient's  allergies indicates no known allergies.  Family History  Problem Relation Age of Onset  . Heart disease Mother   . Heart disease Father    Social History History  Substance Use Topics  . Smoking status: Current Every Day Smoker -- 0.40 packs/day for 12 years    Types: Cigarettes  . Smokeless tobacco: Former Neurosurgeon    Types: Snuff  . Alcohol Use: No   Review of Systems  Constitutional: Negative for fever, chills, sweats. Eyes: Negative for visual changes. ENT: Negative for sore throat, sinus drainage, or earache Cardiovascular: Positive for chest pain. Respiratory: Negative for shortness of breath. Gastrointestinal: Negative for abdominal pain, vomiting and diarrhea. Genitourinary: Negative for dysuria. Musculoskeletal: Negative for back pain. Skin: Negative for rash. Neurological: Negative for headaches, focal weakness or numbness. ____________________________________________  PHYSICAL EXAM:  VITAL SIGNS: ED Triage Vitals  Enc Vitals Group     BP 09/10/14 1148 142/93 mmHg     Pulse Rate 09/10/14 1148 73     Resp 09/10/14 1148 20     Temp 09/10/14 1148 97.9 F (36.6 C)     Temp Source 09/10/14 1148 Oral     SpO2 09/10/14 1148 99 %     Weight 09/10/14 1148 155 lb (70.308 kg)     Height 09/10/14 1148  (1.727 m)  Head Cir --      Peak Flow --      Pain Score 09/10/14 0831 5     Pain Loc --      Pain Edu? --      Excl. in GC? --    Constitutional: Alert and oriented. Well appearing and in no distress. Eyes: Conjunctivae are normal. PERRL. Normal extraocular movements. ENT   Head: Normocephalic and atraumatic.   Nose: No congestion/rhinnorhea.   Mouth/Throat: Mucous membranes are moist.   Neck: No stridor. Hematological/Lymphatic/Immunilogical: No cervical lymphadenopathy. Cardiovascular: Normal rate, regular rhythm.  Respiratory: Normal respiratory effort. No wheezes/rales/rhonchi. Gastrointestinal: Soft and nontender. No  distention. Musculoskeletal: Nontender with normal range of motion in all extremities.No lower extremity tenderness nor edema. Neurologic:  Normal speech and language. No gross focal neurologic deficits are appreciated. Skin:  Skin is warm, dry and intact. No rash noted. Psychiatric: Mood and affect are normal. Patient exhibits appropriate insight and judgment. ____________________________________________    LABS (pertinent positives/negatives) Labs Reviewed  BASIC METABOLIC PANEL - Abnormal; Notable for the following:    Glucose, Bld 132 (*)    All other components within normal limits  CBC  TROPONIN I  TROPONIN I   ____________________________________________  EKG EKG: normal EKG, normal sinus rhythm, unchanged from previous tracings. Rate 94 bpm  Axis Normal No ST changes ____________________________________________   RADIOLOGY CXR IMPRESSION: No active cardiopulmonary disease. ____________________________________________  PROCEDURES  Toradol 20 mg PO  ____________________________________________  INITIAL IMPRESSION / ASSESSMENT AND PLAN / ED COURSE  Intermittent substernal chest pain with onset without exertion yesterday. Normal cardiac enzymes, EKG, CXR, and exam. Patient reassured regarding current exam and labs. Willfollow-up with primary provider or return as needed.  ____________________________________________  FINAL CLINICAL IMPRESSION(S) / ED DIAGNOSES  Final diagnoses:  Chest pain of uncertain etiology     Lissa HoardJenise V Bacon Demoni Gergen, PA-C 09/10/14 1852  Governor Rooksebecca Lord, MD 09/11/14 [phone]

## 2014-09-10 NOTE — Discharge Instructions (Signed)
Chest Pain (Nonspecific) °It is often hard to give a specific diagnosis for the cause of chest pain. There is always a chance that your pain could be related to something serious, such as a heart attack or a blood clot in the lungs. You need to follow up with your health care provider for further evaluation. °CAUSES  °· Heartburn. °· Pneumonia or bronchitis. °· Anxiety or stress. °· Inflammation around your heart (pericarditis) or lung (pleuritis or pleurisy). °· A blood clot in the lung. °· A collapsed lung (pneumothorax). It can develop suddenly on its own (spontaneous pneumothorax) or from trauma to the chest. °· Shingles infection (herpes zoster virus). °The chest wall is composed of bones, muscles, and cartilage. Any of these can be the source of the pain. °· The bones can be bruised by injury. °· The muscles or cartilage can be strained by coughing or overwork. °· The cartilage can be affected by inflammation and become sore (costochondritis). °DIAGNOSIS  °Lab tests or other studies may be needed to find the cause of your pain. Your health care provider may have you take a test called an ambulatory electrocardiogram (ECG). An ECG records your heartbeat patterns over a 24-hour period. You may also have other tests, such as: °· Transthoracic echocardiogram (TTE). During echocardiography, sound waves are used to evaluate how blood flows through your heart. °· Transesophageal echocardiogram (TEE). °· Cardiac monitoring. This allows your health care provider to monitor your heart rate and rhythm in real time. °· Holter monitor. This is a portable device that records your heartbeat and can help diagnose heart arrhythmias. It allows your health care provider to track your heart activity for several days, if needed. °· Stress tests by exercise or by giving medicine that makes the heart beat faster. °TREATMENT  °· Treatment depends on what may be causing your chest pain. Treatment may include: °¨ Acid blockers for  heartburn. °¨ Anti-inflammatory medicine. °¨ Pain medicine for inflammatory conditions. °¨ Antibiotics if an infection is present. °· You may be advised to change lifestyle habits. This includes stopping smoking and avoiding alcohol, caffeine, and chocolate. °· You may be advised to keep your head raised (elevated) when sleeping. This reduces the chance of acid going backward from your stomach into your esophagus. °Most of the time, nonspecific chest pain will improve within 2-3 days with rest and mild pain medicine.  °HOME CARE INSTRUCTIONS  °· If antibiotics were prescribed, take them as directed. Finish them even if you start to feel better. °· For the next few days, avoid physical activities that bring on chest pain. Continue physical activities as directed. °· Do not use any tobacco products, including cigarettes, chewing tobacco, or electronic cigarettes. °· Avoid drinking alcohol. °· Only take medicine as directed by your health care provider. °· Follow your health care provider's suggestions for further testing if your chest pain does not go away. °· Keep any follow-up appointments you made. If you do not go to an appointment, you could develop lasting (chronic) problems with pain. If there is any problem keeping an appointment, call to reschedule. °SEEK MEDICAL CARE IF:  °· Your chest pain does not go away, even after treatment. °· You have a rash with blisters on your chest. °· You have a fever. °SEEK IMMEDIATE MEDICAL CARE IF:  °· You have increased chest pain or pain that spreads to your arm, neck, jaw, back, or abdomen. °· You have shortness of breath. °· You have an increasing cough, or you cough   up blood.  You have severe back or abdominal pain.  You feel nauseous or vomit.  You have severe weakness.  You faint.  You have chills. This is an emergency. Do not wait to see if the pain will go away. Get medical help at once. Call your local emergency services (911 in [place] Do not drive  yourself to the hospital. MAKE SURE YOU:   Understand these instructions.  Will watch your condition.  Will get help right away if you are not doing well or get worse. Document Released: 01/03/2005 Document Revised: 03/31/2013 Document Reviewed: 10/30/2007 [facility] Patient Information 2015 [place] [place]. This information is not intended to replace advice given to you by your health care provider. Make sure you discuss any questions you have with your health care provider.  Your exam, labs, and chest x-ray are essentially normal.  They do not point to a specific cause of your chest pain.  Continue to monitor your symptoms and return as needed.  Hydrate well and rest as needed.

## 2015-02-08 ENCOUNTER — Telehealth: Payer: Self-pay

## 2015-02-08 DIAGNOSIS — F411 Generalized anxiety disorder: Secondary | ICD-10-CM

## 2015-02-08 NOTE — Telephone Encounter (Signed)
Patient is calling to request a refill for clonlazepam

## 2015-02-09 MED ORDER — CLONAZEPAM 1 MG PO TABS: ORAL_TABLET | ORAL | Status: DC

## 2015-02-09 NOTE — Telephone Encounter (Signed)
1 mo Meds ordered this encounter  Medications  . clonazePAM (KLONOPIN) 1 MG tablet    Sig: TAKE ONE TABLET BY MOUTH IN THE MORNING AND ONE IN THE AFTERNOON AND TWO AT BEDTIME    Dispense:  120 tablet    Refill:  0

## 2015-02-09 NOTE — Telephone Encounter (Signed)
Spoke with pt, advised message. Pt states he just does not have the money and would take 1-2 weeks worth if possible. Please advise.

## 2015-02-09 NOTE — Telephone Encounter (Signed)
Time for his 6 month followup

## 2015-02-10 NOTE — Telephone Encounter (Signed)
Faxed and notified pt Dr Florence Canneroolittle OKd one more month refill to give him time to come in. Pt thanked us.

## 2015-03-07 ENCOUNTER — Encounter: Payer: Self-pay | Admitting: Internal Medicine

## 2015-03-08 ENCOUNTER — Ambulatory Visit (INDEPENDENT_AMBULATORY_CARE_PROVIDER_SITE_OTHER): Payer: Self-pay | Admitting: Internal Medicine

## 2015-03-08 VITALS — BP 140/90 | HR 85 | Temp 98.2°F | Resp 16 | Ht 68.0 in | Wt 167.4 lb

## 2015-03-08 DIAGNOSIS — F411 Generalized anxiety disorder: Secondary | ICD-10-CM

## 2015-03-08 MED ORDER — CLONAZEPAM 1 MG PO TABS: ORAL_TABLET | ORAL | Status: DC

## 2015-03-08 NOTE — Progress Notes (Signed)
Subjective:    Patient ID: Vernon Hudson, male    DOB: Nov 23, 1980, 34 y.o.   MRN: [id number] This chart was scribed for Ellamae Siaobert Adler Chartrand, MD by Jolene Provostobert Halas, Medical Scribe. This patient was seen in Room 4 and the patient's care was started a 4:13 PM.   Chief Complaint  Patient presents with  . Medication Refill   HPI HPI Comments: Vernon RimaDavid B Hudson is a 34 y.o. male who [facility] medication ref[facility]. He has a past hx of substance abuse. He states he sometimes wakes up in the middle of the night, and takes a klonopin to fall back asleep. He also intermittently wakes up in the morning with his heart racing.  He is on .5mg  of suboxone currently.  He states he got married in the last six months. He states that he stopped smoking pot on August, 28th 2016. For many years he had smoked pot several times per day. He lives with his wife and his daughter. His daughter is 194 y/o and his wife stays home and takes care of their apartment and daughter. He is no longer taking trazodone. He is taking one zoloft per day down from 1.5. Off trazadone. New peer group.  Past Medical History  Diagnosis Date  . Anxiety   . Substance abuse   . Heart murmur    Current Outpatient Prescriptions on File Prior to Visit  Medication Sig Dispense Refill  . clonazePAM (KLONOPIN) 1 MG tablet TAKE ONE TABLET BY MOUTH IN THE MORNING AND ONE IN THE AFTERNOON AND TWO AT BEDTIME 120 tablet 0  . sertraline (ZOLOFT) 100 MG tablet Take 1.5 tablets (150 mg total) by mouth daily. 45 tablet 11  . SUBOXONE 4-1 MG FILM   0  . traZODone (DESYREL) 100 MG tablet Take 1 tablet (100 mg total) by mouth at bedtime as needed for sleep. 30 tablet 5   No current facility-administered medications on file prior to visit.   Review of Systems  Constitutional: Negative for fever and chills.  Cardiovascular: Positive for palpitations. Negative for chest pain.  Psychiatric/Behavioral: Positive for sleep disturbance. The  patient is nervous/anxious.       Objective:  BP 140/90 mmHg  Pulse 85  Temp(Src) 98.2 F (36.8 C) (Oral)  Resp 16  Ht 5\' 8"  (1.727 m)  Wt 167 lb 6.4 oz (75.932 kg)  BMI 25.46 kg/m2  SpO2 97%  Physical Exam  Constitutional: He is oriented to person, place, and time. He appears well-developed and well-nourished. No distress.  HENT:  Head: Normocephalic and atraumatic.  Eyes: Pupils are equal, round, and reactive to light.  Neck: Neck supple.  Cardiovascular: Normal rate.   Pulmonary/Chest: Effort normal. No respiratory distress.  Musculoskeletal: Normal range of motion.  Neurological: He is alert and oriented to person, place, and time. Coordination normal.  Skin: Skin is warm and dry. He is not diaphoretic.  Psychiatric: He has a normal mood and affect. His behavior is normal.  Nursing note and vitals reviewed.     Assessment & Plan:  GAD (generalized anxiety disorder) - Plan: clonazePAM (KLONOPIN) 1 MG tablet  Subst Ab---stable!!!!!  I have completed the patient encounter in its entirety as documented by the scribe, with editing by me where necessary. Monty Spicher P. Merla Richesoolittle, M.D.   By signing my name below, I, Javier Dockerobert Ryan Halas, attest that this documentation has been prepared under the direction and in the presence of Ellamae Siaobert Shyler Holzman, MD. Electronically Signed: Javier Dockerobert Ryan Halas,  ER Scribe. 03/08/2015. 4:13 PM.

## 2015-08-20 ENCOUNTER — Ambulatory Visit (INDEPENDENT_AMBULATORY_CARE_PROVIDER_SITE_OTHER): Payer: Self-pay | Admitting: Internal Medicine

## 2015-08-20 VITALS — BP 126/78 | HR 91 | Temp 97.8°F | Resp 16 | Ht 66.5 in | Wt 167.1 lb

## 2015-08-20 DIAGNOSIS — F411 Generalized anxiety disorder: Secondary | ICD-10-CM

## 2015-08-20 MED ORDER — SERTRALINE HCL 100 MG PO TABS: 100.0000 mg | ORAL_TABLET | Freq: Every day | ORAL | Status: DC

## 2015-08-20 MED ORDER — CLONAZEPAM 1 MG PO TABS: ORAL_TABLET | ORAL | Status: DC

## 2015-08-20 NOTE — Progress Notes (Signed)
By signing my name below I, Shelah LewandowskyJoseph Thomas, attest that this documentation has been prepared under the direction and in the presence of Ellamae Siaobert Sona Nations, MD. Electonically Signed. Shelah LewandowskyJoseph Thomas, Scribe 08/20/2015 at 4:14 PM  Subjective:    Patient ID: Vernon Hudson, male    DOB: 20-Jan-1981, 35 y.o.   MRN: [id number]  Chief Complaint  Patient presents with  . Medication Refill    Sertraline & Clonazepam    HPI Vernon Hudson is a 35 y.o. male who presents to the [facility] for routine f/u and sertraline and clonazepam refill.   Pt has been weaning himself off of suboxone thru treatment center- he is out of suboxone in 3 d and does not plan on getting a refill. Apprehensive but glad to be heading in this direction. Things continue to go well at home and work! 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 It has been pointed out to him, now that he is assuming more responsibilities, that he is having attention issues at work where he has trouble focusing on one project at a time. Trouble reading thru things. Missing follow thru. Wife says same at home. Hx suggests he was likely ADD in high school and never recognized.  See last OV 11/16--recent marriage/stopped MJ etc/had stopped traz/decr sertraline to 100 etc./new peer group.  Insomnia and RLS still on and off. Klon helps.  Still uninsured  [facility]   Diagnosis Date Noted  . Chronic pain syndrome 11/06/2011  . 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 Depression 11/06/2011  . GAD (generalized anxiety disorder) 11/06/2011  . Nicotine addiction 11/06/2011    Current outpatient prescriptions:  .  clonazePAM (KLONOPIN) 1 MG tablet, TAKE ONE TABLET BY MOUTH IN THE MORNING AND ONE IN THE AFTERNOON AND TWO AT BEDTIME, Disp: 120 tablet, Rfl: 5 .  sertraline (ZOLOFT) 100 MG tablet, Take 1.5 tablets (150 mg total) by mouth daily., Disp: 45 tablet, Rfl:  11  No Known Allergies   Review of Systems Still smoking    Objective:   Physical Exam  Constitutional: He is oriented to person, place, and time. He appears well-developed and well-nourished. No distress.  HENT:  Head: Normocephalic and atraumatic.  Eyes: Conjunctivae are normal. Pupils are equal, round, and reactive to light.  Neck: Neck supple.  Cardiovascular: Normal rate.   Pulmonary/Chest: Effort normal.  Musculoskeletal: Normal range of motion.  Neurological: He is alert and oriented to person, place, and time.  Skin: Skin is warm and dry.  Psychiatric: He has a normal mood and affect. His behavior is normal.  Nursing note and vitals reviewed. ADHD scale very positive  Filed Vitals:   08/20/15 1527  BP: 126/78  Pulse: 91  Temp: 97.8 F (36.6 C)  TempSrc: Oral  Resp: 16  Height: 5' 6.5" (1.689 m)  Weight: 167 lb 2 oz (75.807 kg)  SpO2: 98%         Assessment & Plan:  GAD (generalized anxiety disorder) - Plan: clonazePAM / sertraline continued  ADHD likely--referred to Psychiatry in [place] to consider therapy as I retire this month  He has make remarkable progress in resolving hx subst ab thru methadone then suboxone!!!!  Meds ordered this encounter  Medications  . sertraline (ZOLOFT) 100 MG tablet    Sig: Take 1 tablet (100 mg total) by mouth daily.    Dispense:  90 tablet    Refill:  3  .  clonazePAM (KLONOPIN) 1 MG tablet    Sig: TAKE ONE TABLET BY MOUTH IN THE MORNING AND ONE IN THE AFTERNOON AND TWO AT BEDTIME    Dispense:  120 tablet    Refill:  5   Gave names for new PCP here if he chooses to continue here.  I have completed the patient encounter in its entirety as documented by the scribe, with editing by me where necessary. Vernon Hudson, M.D.

## 2015-08-20 NOTE — Patient Instructions (Signed)
[facility] --[facility]   [phone] --Dr Homero Fellersupindar Christean LeafKaur  [place], [place] ADHD : Donella StadeWilliam J. Ryan William J Ryan MD [address] Friday[address] [place], [place] [address] [phone] 43 years experience  1 office location(s) 2  [facility] [address] [place], [place] [address] [phone] 34 years experience  2 office location(s) 3  [facility] [address] [place], [place] [phone] 1 office location(s) 4  Maryjean MornPhilip H. Darcella CheshireLavine Philip H Lavine Md [address] [place], [place] [address] [phone]

## 2016-02-09 ENCOUNTER — Ambulatory Visit (INDEPENDENT_AMBULATORY_CARE_PROVIDER_SITE_OTHER): Payer: Self-pay | Admitting: Urgent Care

## 2016-02-09 VITALS — BP 126/80 | HR 100 | Temp 97.8°F | Resp 18 | Ht 66.5 in | Wt 165.2 lb

## 2016-02-09 DIAGNOSIS — F419 Anxiety disorder, unspecified: Secondary | ICD-10-CM

## 2016-02-09 DIAGNOSIS — G4700 Insomnia, unspecified: Secondary | ICD-10-CM

## 2016-02-09 LAB — COMPREHENSIVE METABOLIC PANEL
ALT: 183 U/L — ABNORMAL HIGH (ref 9–46)
AST: 118 U/L — ABNORMAL HIGH (ref 10–40)
Albumin: 4.9 g/dL (ref 3.6–5.1)
Alkaline Phosphatase: 80 U/L (ref 40–115)
BUN: 13 mg/dL (ref 7–25)
CO2: 25 mmol/L (ref 20–31)
CREATININE: 0.93 mg/dL (ref 0.60–1.35)
Calcium: 10.2 mg/dL (ref 8.6–10.3)
Chloride: 98 mmol/L (ref 98–110)
Glucose, Bld: 83 mg/dL (ref 65–99)
Potassium: 4.6 mmol/L (ref 3.5–5.3)
SODIUM: 136 mmol/L (ref 135–146)
TOTAL PROTEIN: 7.9 g/dL (ref 6.1–8.1)
Total Bilirubin: 0.8 mg/dL (ref 0.2–1.2)

## 2016-02-09 LAB — TSH: TSH: 0.73 m[IU]/L (ref 0.40–4.50)

## 2016-02-09 MED ORDER — CLONAZEPAM 1 MG PO TABS: 1.0000 mg | ORAL_TABLET | Freq: Three times a day (TID) | ORAL | 0 refills | Status: DC

## 2016-02-09 MED ORDER — CLONAZEPAM 1 MG PO TABS: 1.0000 mg | ORAL_TABLET | Freq: Two times a day (BID) | ORAL | 0 refills | Status: DC

## 2016-02-09 MED ORDER — SERTRALINE HCL 50 MG PO TABS: 50.0000 mg | ORAL_TABLET | Freq: Every day | ORAL | 3 refills | Status: DC

## 2016-02-09 MED ORDER — CLONAZEPAM 1 MG PO TABS: 1.0000 mg | ORAL_TABLET | Freq: Two times a day (BID) | ORAL | 0 refills | Status: AC

## 2016-02-09 MED ORDER — CLONAZEPAM 0.5 MG PO TABS: 0.5000 mg | ORAL_TABLET | Freq: Two times a day (BID) | ORAL | 1 refills | Status: AC | PRN

## 2016-02-09 MED ORDER — CLONAZEPAM 1 MG PO TABS: 1.0000 mg | ORAL_TABLET | Freq: Every day | ORAL | 0 refills | Status: AC

## 2016-02-09 MED ORDER — HYDROXYZINE PAMOATE 50 MG PO CAPS: 50.0000 mg | ORAL_CAPSULE | Freq: Every day | ORAL | 0 refills | Status: AC | PRN

## 2016-02-09 MED ORDER — SERTRALINE HCL 100 MG PO TABS: 100.0000 mg | ORAL_TABLET | Freq: Every day | ORAL | 3 refills | Status: DC

## 2016-02-09 NOTE — Patient Instructions (Addendum)
Taper from Klonopin: Week 1-2: Take 3 tablets daily as needed. Week 3-4: Take 2 tablets daily as needed. Week 5-6: Take 1 tablet daily as needed. Week 7+: Take 1 tablet twice daily as needed.  Make sure you follow up with me by phone thereafter.     [facility] [address] [place], [place] [address]  Shanon Rosser [phone]  Maris Berger [phone]  Marco Collie [phone]    Hydroxyzine capsules or tablets What is this medicine? HYDROXYZINE (hye DROX i zeen) is an antihistamine. This medicine is used to treat allergy symptoms. It is also used to treat anxiety and tension. This medicine can be used with other medicines to induce sleep before surgery. This medicine may be used for other purposes; ask your health care provider or pharmacist if you have questions. What should I tell my health care provider before I take this medicine? They need to know if you have any of these conditions: -any chronic illness -difficulty passing urine -glaucoma -heart disease -kidney disease -liver disease -lung disease -an unusual or allergic reaction to hydroxyzine, cetirizine, other medicines, foods, dyes, or preservatives -pregnant or trying to get pregnant -breast-feeding How should I use this medicine? Take this medicine by mouth with a full glass of water. Follow the directions on the prescription label. You may take this medicine with food or on an empty stomach. Take your medicine at regular intervals. Do not take your medicine more often than directed. Talk to your pediatrician regarding the use of this medicine in children. Special care may be needed. While this drug may be prescribed for children as young as 20 years of age for selected conditions, precautions do apply. Patients over 37 years old may have a stronger reaction and need a smaller dose. Overdosage: If you think you have taken too much of this medicine contact a poison control center or  emergency room at once. NOTE: This medicine is only for you. Do not share this medicine with others. What if I miss a dose? If you miss a dose, take it as soon as you can. If it is almost time for your next dose, take only that dose. Do not take double or extra doses. What may interact with this medicine? -alcohol -barbiturate medicines for sleep or seizures -medicines for colds, allergies -medicines for depression, anxiety, or emotional disturbances -medicines for pain -medicines for sleep -muscle relaxants This list may not describe all possible interactions. Give your health care provider a list of all the medicines, herbs, non-prescription drugs, or dietary supplements you use. Also tell them if you smoke, drink alcohol, or use illegal drugs. Some items may interact with your medicine. What should I watch for while using this medicine? Tell your doctor or health care professional if your symptoms do not improve. You may get drowsy or dizzy. Do not drive, use machinery, or do anything that needs mental alertness until you know how this medicine affects you. Do not stand or sit up quickly, especially if you are an older patient. This reduces the risk of dizzy or fainting spells. Alcohol may interfere with the effect of this medicine. Avoid alcoholic drinks. Your mouth may get dry. Chewing sugarless gum or sucking hard candy, and drinking plenty of water may help. Contact your doctor if the problem does not go away or is severe. This medicine may cause dry eyes and blurred vision. If you wear contact lenses you may feel some discomfort. Lubricating drops may help. See your eye doctor if  the problem does not go away or is severe. If you are receiving skin tests for allergies, tell your doctor you are using this medicine. What side effects may I notice from receiving this medicine? Side effects that you should report to your doctor or health care professional as soon as possible: -fast or  irregular heartbeat -difficulty passing urine -seizures -slurred speech or confusion -tremor Side effects that usually do not require medical attention (report to your doctor or health care professional if they continue or are bothersome): -constipation -drowsiness -fatigue -headache -stomach upset This list may not describe all possible side effects. Call your doctor for medical advice about side effects. You may report side effects to FDA at 1-800-FDA-1088. Where should I keep my medicine? Keep out of the reach of children. Store at room temperature between 15 and 30 degrees C (59 and 86 degrees F). Keep container tightly closed. Throw away any unused medicine after the expiration date. NOTE: This sheet is a summary. It may not cover all possible information. If you have questions about this medicine, talk to your doctor, pharmacist, or health care provider.    2016, Elsevier/Gold Standard. (2007-08-08 14:50:59)    Generalized Anxiety Disorder Generalized anxiety disorder (GAD) is a mental disorder. It interferes with life functions, including relationships, work, and school. GAD is different from normal anxiety, which everyone experiences at some point in their lives in response to specific life events and activities. Normal anxiety actually helps us prepare for and get through these life events and activities. Normal anxiety goes away after the event or activity is over.  GAD causes anxiety that is not necessarily related to specific events or activities. It also causes excess anxiety in proportion to specific events or activities. The anxiety associated with GAD is also difficult to control. GAD can vary from mild to severe. People with severe GAD can have intense waves of anxiety with physical symptoms (panic attacks).  SYMPTOMS The anxiety and worry associated with GAD are difficult to control. This anxiety and worry are related to many life events and activities and also occur more  days than not for 6 months or longer. People with GAD also have three or more of the following symptoms (one or more in children):  Restlessness.   Fatigue.  Difficulty concentrating.   Irritability.  Muscle tension.  Difficulty sleeping or unsatisfying sleep. DIAGNOSIS GAD is diagnosed through an assessment by your health care provider. Your health care provider will ask you questions aboutyour mood,physical symptoms, and events in your life. Your health care provider may ask you about your medical history and use of alcohol or drugs, including prescription medicines. Your health care provider may also do a physical exam and blood tests. Certain medical conditions and the use of certain substances can cause symptoms similar to those associated with GAD. Your health care provider may refer you to a mental health specialist for further evaluation. TREATMENT The following therapies are usually used to treat GAD:   Medication. Antidepressant medication usually is prescribed for long-term daily control. Antianxiety medicines may be added in severe cases, especially when panic attacks occur.   Talk therapy (psychotherapy). Certain types of talk therapy can be helpful in treating GAD by providing support, education, and guidance. A form of talk therapy called cognitive behavioral therapy can teach you healthy ways to think about and react to daily life events and activities.  Stress managementtechniques. These include yoga, meditation, and exercise and can be very helpful when they are practiced  regularly. A mental health specialist can help determine which treatment is best for you. Some people see improvement with one therapy. However, other people require a combination of therapies.   This information is not intended to replace advice given to you by your health care provider. Make sure you discuss any questions you have with your health care provider.   Document Released: 07/21/2012  Document Revised: 04/16/2014 Document Reviewed: 07/21/2012 Elsevier Interactive Patient Education [employer]2016 Elsevier Inc.     IF you received an x-ray today, you will receive an invoice from [facility]. Please contact [facility] at [phone] with questions or concerns regarding your invoice.   IF you received labwork today, you will receive an invoice from [facility]. Please contact [facility] at [phone] with questions or concerns regarding your invoice.   Our billing staff will not be able to assist you with questions regarding bills from these companies.  You will be contacted with the lab results as soon as they are available. The fastest way to get your results is to activate your My Chart account. Instructions are located on the last page of this paperwork. If you have not heard from us regarding the results in 2 weeks, please contact this office.

## 2016-02-09 NOTE — Progress Notes (Signed)
    MRN: [id number] DOB: 1981/04/07  Subjective:   Carmela RimaDavid B Hairston is a 35 y.o. 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 Finished subaxone course ~8 months ago. He has been taking Klonopin 4mg  daily for anxiety and sleep. Has failed Prozac due to increased irritability. Has failed trazodone due to adverse effects of constipation. Patient is not willing to take muscle relaxant. He has worked with Dr. Merla Richesoolittle for the past 8-10 years. His anxiety stemmed from his lifestyle at that time including his drug use. His anxiety involves worrying very easily, stress with financial burden of his earnings and supporting his daughter. He does have periods of feeling overwhelmed, stress causes him intense worrying and chest pain once monthly or every 2 months. Has had to go the ED for the periods of chest pain, had normal work up. Last visit was in 2016.  Onalee HuaDavid has a current medication list which includes the following prescription(s): clonazepam and sertraline. Also has No Known Allergies.  Onalee HuaDavid  has a past medical history of Anxiety; Heart murmur; and Substance abuse. Also  has a past surgical history that includes Fracture surgery.  Objective:   Vitals: BP 126/80   Pulse 100   Temp 97.8 F (36.6 C) (Oral)   Resp 18   Ht 5' 6.5" (1.689 m)   Wt 165 lb 3.2 oz (74.9 kg)   SpO2 98%   BMI 26.26 kg/m   Physical Exam  Constitutional: He is oriented to person, place, and time. He appears well-developed and well-nourished.  Cardiovascular: Normal rate.   Pulmonary/Chest: Effort normal.  Neurological: He is alert and oriented to person, place, and time.  Psychiatric: His mood appears not anxious. His affect is not blunt and not labile. His speech is not rapid and/or pressured, not delayed, not tangential and not slurred. He is not aggressive and not slowed. He does not exhibit a depressed mood. He expresses no  homicidal and no suicidal ideation.   Assessment and Plan :   1. Anxiety disorder, unspecified type 2. Insomnia, unspecified type - Patient agreed to taper off of Klonopin and see me as his provider in the future for refills and management of his anxiety. He was given specific instructions to taper. We will increase his Zoloft from 100mg  daily to 150mg  daily. In the meantime, the patient will start Vistaril 50-100mg  for anxiety and sleep. Patient will contact therapists provided in his instructions list. Follow up by phone in 6 weeks. Follow up in office in 3 months.  Wallis BambergMario Weylyn Ricciuti, PA-C [facility] [phone] 02/09/2016 11:17 AM

## 2016-06-27 ENCOUNTER — Emergency Department: Payer: Self-pay

## 2016-06-27 ENCOUNTER — Emergency Department
Admission: EM | Admit: 2016-06-27 | Discharge: 2016-06-28 | Disposition: A | Discharge status: Home / Self Care | Payer: Self-pay | Attending: Emergency Medicine | Admitting: Emergency Medicine

## 2016-06-27 ENCOUNTER — Encounter: Payer: Self-pay | Admitting: *Deleted

## 2016-06-27 DIAGNOSIS — R059 Cough, unspecified: Secondary | ICD-10-CM

## 2016-06-27 DIAGNOSIS — R0981 Nasal congestion: Secondary | ICD-10-CM | POA: Insufficient documentation

## 2016-06-27 DIAGNOSIS — Z79899 Other long term (current) drug therapy: Secondary | ICD-10-CM | POA: Insufficient documentation

## 2016-06-27 DIAGNOSIS — R509 Fever, unspecified: Secondary | ICD-10-CM | POA: Insufficient documentation

## 2016-06-27 DIAGNOSIS — M791 Myalgia: Secondary | ICD-10-CM | POA: Insufficient documentation

## 2016-06-27 DIAGNOSIS — R05 Cough: Secondary | ICD-10-CM | POA: Insufficient documentation

## 2016-06-27 DIAGNOSIS — F17210 Nicotine dependence, cigarettes, uncomplicated: Secondary | ICD-10-CM | POA: Insufficient documentation

## 2016-06-27 MED ORDER — SODIUM CHLORIDE 0.9 % IV BOLUS (SEPSIS)
1000.0000 mL | Freq: Once | INTRAVENOUS | Status: AC
  Administered 2016-06-28: 1000 mL via INTRAVENOUS

## 2016-06-27 NOTE — ED Provider Notes (Signed)
[facility] Emergency Department Provider Note  ____________________________________________  Time seen: Approximately 11:52 PM  I have reviewed the triage vital signs and the nursing notes.   HISTORY  Chief Complaint Influenza    HPI Vernon Hudson is a 36 y.o. male with a history of substance abuse presents to the emergency department with frontal headache, congestion, productive cough, rhinorrhea, myalgias and malaise for the past 4 days. Patient states that he was diagnosed with influenza approximately 3 weeks ago. Patient states that his symptoms seemingly improved until 4 days ago. Patient has been febrile. Fever has been as high as 105F assessed temporally. Patient is tolerating fluids by mouth. He has experienced diminished appetite and increased sleep. Patient is accompanied by his wife. 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  Patient denies chest pain, shortness of breath, nausea, vomiting and abdominal pain. Patient has taken Tylenol but has attempted no other alleviating measures.   Past Medical History:  Diagnosis Date  . Anxiety   . Heart murmur   . Substance abuse     Patient Active Problem List   Diagnosis Date Noted  . Chronic pain syndrome 11/06/2011  . 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 Depression 11/06/2011  . GAD (generalized anxiety disorder) 11/06/2011  . Nicotine addiction 11/06/2011    Past Surgical History:  Procedure Laterality Date  . FRACTURE SURGERY      Prior to Admission medications   Medication Sig Start Date End Date Taking? Authorizing Provider  clonazePAM (KLONOPIN) 0.5 MG tablet Take 1 tablet (0.5 mg total) by mouth 2 (two) times daily as needed for anxiety. 02/09/16   Wallis Bamberg, PA-C  clonazePAM (KLONOPIN) 1 MG tablet Take 1 tablet (1 mg total) by mouth 2 (two) times daily. 02/09/16   Wallis Bamberg, PA-C  clonazePAM (KLONOPIN) 1 MG tablet Take 1 tablet (1 mg  total) by mouth daily. 02/09/16   Wallis Bamberg, PA-C  hydrOXYzine (VISTARIL) 50 MG capsule Take 1-2 capsules (50-100 mg total) by mouth daily as needed for anxiety (Sleep). 02/09/16   Wallis Bamberg, PA-C  sertraline (ZOLOFT) 100 MG tablet Take 1 tablet (100 mg total) by mouth daily. 02/09/16   Wallis Bamberg, PA-C  sertraline (ZOLOFT) 50 MG tablet Take 1 tablet (50 mg total) by mouth daily. 02/09/16   Wallis Bamberg, PA-C    Allergies Patient has no known allergies.  Family History  Problem Relation Age of Onset  . Heart disease Mother   . Heart disease Father     Social History Social History  Substance Use Topics  . Smoking status: Current Every Day Smoker    Packs/day: 0.40    Years: 12.00    Types: Cigarettes  . Smokeless tobacco: Former Neurosurgeon    Types: Snuff  . Alcohol use No     Review of Systems  Constitutional: Patient has had fever.  Eyes: No visual changes. No discharge ENT: Patient has had congestion.  Cardiovascular: no chest pain. Respiratory: Patient has had non-productive cough.  No SOB. Gastrointestinal: No nausea, vomiting or diarrhea. Genitourinary: Negative for dysuria. No hematuria Musculoskeletal: Patient has had myalgias. Skin: Negative for rash, abrasions, lacerations, ecchymosis. Neurological: Negative for headaches, focal weakness or numbness.  ____________________________________________   PHYSICAL EXAM:  VITAL SIGNS: ED Triage Vitals  Enc Vitals Group     BP 06/27/16 2155 (!) 147/81     Pulse Rate 06/27/16 2155 (!) 105     Resp 06/27/16 2155 20  Temp 06/27/16 2155 (!) 102.6 F (39.2 C)     Temp Source 06/27/16 2155 Oral     SpO2 06/27/16 2155 97 %     Weight 06/27/16 2156 165 lb (74.8 kg)     Height 06/27/16 2156 5\' 7"  (1.702 m)     Head Circumference --      Peak Flow --      Pain Score 06/27/16 2156 4     Pain Loc --      Pain Edu? --      Excl. in GC? --     Constitutional: Alert and oriented. Patient is lying supine in bed.  Eyes:  Conjunctivae are normal. PERRL. EOMI. Head: Atraumatic. ENT:      Ears: Tympanic membranes are injected bilaterally without evidence of effusion or purulent exudate. Bony landmarks are visualized bilaterally. No pain with palpation at the tragus.      Nose: Nasal turbinates are edematous and erythematous. Copious rhinorrhea visualized.      Mouth/Throat: Mucous membranes are moist. Posterior pharynx is mildly erythematous. No tonsillar hypertrophy or purulent exudate. Uvula is midline. Neck: Full range of motion. No pain is elicited with flexion at the neck. Hematological/Lymphatic/Immunilogical: No cervical lymphadenopathy. Cardiovascular: Normal rate, regular rhythm. Normal S1 and S2.  Good peripheral circulation. Respiratory: Normal respiratory effort without tachypnea or retractions. Lungs CTAB. Good air entry to the bases with no decreased or absent breath sounds. Gastrointestinal: Bowel sounds 4 quadrants. Soft and nontender to palpation. No guarding or rigidity. No palpable masses. No distention. No CVA tenderness.  Skin:  Skin is warm, dry and intact. No rash noted. Psychiatric: Mood and affect are normal. Speech and behavior are normal. Patient exhibits appropriate insight and judgement.  ____________________________________________   LABS (all labs ordered are listed, but only abnormal results are displayed)  Labs Reviewed  CULTURE, BLOOD (ROUTINE X 2)  CULTURE, BLOOD (ROUTINE X 2)  INFLUENZA PANEL BY PCR (TYPE A & B)  CBC WITH DIFFERENTIAL/PLATELET  BASIC METABOLIC PANEL  LACTATE DEHYDROGENASE  URINALYSIS, ROUTINE W REFLEX MICROSCOPIC  HEPATIC FUNCTION PANEL  TROPONIN I   ____________________________________________  EKG   ____________________________________________  RADIOLOGY   No results found.  ____________________________________________    PROCEDURES  Procedure(s) performed:    Procedures    Medications  sodium chloride 0.9 % bolus 1,000 mL  (not administered)     ____________________________________________   INITIAL IMPRESSION / ASSESSMENT AND PLAN / ED COURSE  Pertinent labs & imaging results that were available during my care of the patient were reviewed by me and considered in my medical decision making (see chart for details).  Review of the [place] CSRS was performed in accordance of the [facility] prior to dispensing any controlled drugs.     Assessment and plan: Influenza:  Patient presents to the emergency department with headache, congestion, rhinorrhea, fatigue and myalgias for the past 4 days. Patient was previously diagnosed with influenza 3 weeks ago. Because patient presented to the emergency department with high fever along with tachypnea and tachycardia, further workup is warranted. Patient's case was discussed with Dr. York CeriseForbach who accepted patient transfer to main side of the emergency department for further care and management. ____________________________________________        This chart was dictated using voice recognition software/Dragon. Despite best efforts to proofread, errors can occur which can change the meaning. Any change was purely unintentional.    Orvil FeilJaclyn M Ayala Ribble, PA-C 06/28/16 0002    Loleta Roseory Forbach, MD 06/28/16 [phone]

## 2016-06-27 NOTE — ED Notes (Signed)
Patient transported to X-ray 

## 2016-06-27 NOTE — ED Triage Notes (Addendum)
Pt ambulatory to triage.  Pt reports fever, chills, bodyaches for 1 day.  Pt states feeling for 4 days.  Pt alert.   Pt tool mucinex 1 hour ago

## 2016-06-28 LAB — HEPATIC FUNCTION PANEL
ALBUMIN: 4.8 g/dL (ref 3.5–5.0)
ALT: 146 U/L — ABNORMAL HIGH (ref 17–63)
AST: 104 U/L — ABNORMAL HIGH (ref 15–41)
Alkaline Phosphatase: 78 U/L (ref 38–126)
BILIRUBIN TOTAL: 0.7 mg/dL (ref 0.3–1.2)
Total Protein: 8.5 g/dL — ABNORMAL HIGH (ref 6.5–8.1)

## 2016-06-28 LAB — BASIC METABOLIC PANEL
Anion gap: 12 (ref 5–15)
BUN: 8 mg/dL (ref 6–20)
CO2: 24 mmol/L (ref 22–32)
Calcium: 9.3 mg/dL (ref 8.9–10.3)
Chloride: 101 mmol/L (ref 101–111)
Creatinine, Ser: 0.94 mg/dL (ref 0.61–1.24)
GFR calc Af Amer: >60 mL/min (ref >60)
GFR calc non Af Amer: >60 mL/min (ref >60)
Glucose, Bld: 119 mg/dL — ABNORMAL HIGH (ref 65–99)
Potassium: 3.8 mmol/L (ref 3.5–5.1)
Sodium: 137 mmol/L (ref 135–145)

## 2016-06-28 LAB — URINALYSIS, ROUTINE W REFLEX MICROSCOPIC
BILIRUBIN URINE: NEGATIVE
GLUCOSE, UA: NEGATIVE mg/dL
Hgb urine dipstick: NEGATIVE
KETONES UR: NEGATIVE mg/dL
Leukocytes, UA: NEGATIVE
NITRITE: NEGATIVE
Protein, ur: NEGATIVE mg/dL
Specific Gravity, Urine: 1.001 — ABNORMAL LOW (ref 1.005–1.030)
pH: 6 (ref 5.0–8.0)

## 2016-06-28 LAB — CBC WITH DIFFERENTIAL/PLATELET
Basophils Absolute: 0 10*3/uL (ref 0–0.1)
Basophils Relative: 0 %
Eosinophils Absolute: 0.1 10*3/uL (ref 0–0.7)
Eosinophils Relative: 1 %
HCT: 47.3 % (ref 40.0–52.0)
Hemoglobin: 16.4 g/dL (ref 13.0–18.0)
Lymphocytes Relative: 10 %
Lymphs Abs: 1.1 10*3/uL (ref 1.0–3.6)
MCH: 31.9 pg (ref 26.0–34.0)
MCHC: 34.6 g/dL (ref 32.0–36.0)
MCV: 92.2 fL (ref 80.0–100.0)
Monocytes Absolute: 0.6 10*3/uL (ref 0.2–1.0)
Monocytes Relative: 5 %
Neutro Abs: 9.4 10*3/uL — ABNORMAL HIGH (ref 1.4–6.5)
Neutrophils Relative %: 84 %
Platelets: 257 10*3/uL (ref 150–440)
RBC: 5.13 MIL/uL (ref 4.40–5.90)
RDW: 12.4 % (ref 11.5–14.5)
WBC: 11.1 10*3/uL — ABNORMAL HIGH (ref 3.8–10.6)

## 2016-06-28 LAB — INFLUENZA PANEL BY PCR (TYPE A & B)
Influenza A By PCR: NEGATIVE
Influenza B By PCR: NEGATIVE

## 2016-06-28 LAB — TROPONIN I

## 2016-06-28 LAB — LACTIC ACID, PLASMA: Lactic Acid, Venous: 1.9 mmol/L (ref 0.5–1.9)

## 2016-06-28 MED ORDER — SODIUM CHLORIDE 0.9 % IV BOLUS (SEPSIS)
1000.0000 mL | Freq: Once | INTRAVENOUS | Status: AC
  Administered 2016-06-28: 1000 mL via INTRAVENOUS

## 2016-06-28 MED ORDER — CEPHALEXIN 500 MG PO CAPS: 500.0000 mg | ORAL_CAPSULE | Freq: Three times a day (TID) | ORAL | 0 refills | Status: DC

## 2016-06-28 MED ORDER — AZITHROMYCIN 250 MG PO TABS: ORAL_TABLET | ORAL | 0 refills | Status: DC

## 2016-06-28 MED ORDER — SODIUM CHLORIDE 0.9 % IV BOLUS (SEPSIS)
250.0000 mL | Freq: Once | INTRAVENOUS | Status: AC
  Administered 2016-06-28: 250 mL via INTRAVENOUS

## 2016-06-28 NOTE — Discharge Instructions (Signed)
In general, your workup was reassuring.  However, given your fever and productive cough and duration of symptoms, we will treat you empirically for possible community-acquired pneumonia.  Please take the full course of antibiotics as prescribed and do not stop either of them early.  Return to the emergency department if you develop new or worsening symptoms that concern you.

## 2016-06-29 LAB — BLOOD CULTURE ID PANEL (REFLEXED)

## 2016-07-03 LAB — CULTURE, BLOOD (ROUTINE X 2): Culture: NO GROWTH

## 2016-07-10 LAB — CULTURE, BLOOD (ROUTINE X 2)

## 2016-07-11 NOTE — Progress Notes (Signed)
After BCID positive (1 of 4 bottles) GPC, final identification was canceled due to laboratory error. Patient was discharged from ED with 10 days of cephalexin 500 mg po TID and azithromycin 5 day course. I spoke with Mr. Moncus this afternoon - he says he finished his antibiotics yesterday and feels "tip-top" today. No additional therapy is recommended at this point.   Shaquille Janes A. [place], [place].D., BCPS Clinical Pharmacist 07/11/2016 13:04

## 2016-11-07 ENCOUNTER — Other Ambulatory Visit: Payer: Self-pay | Admitting: Urgent Care

## 2016-11-08 NOTE — Telephone Encounter (Signed)
Patient was supposed to follow up with in 05/2016. He needs an OV for any additional refills.

## 2016-12-16 ENCOUNTER — Emergency Department: Payer: Self-pay

## 2016-12-16 ENCOUNTER — Emergency Department
Admission: EM | Admit: 2016-12-16 | Discharge: 2016-12-16 | Disposition: A | Discharge status: Home / Self Care | Payer: Self-pay | Attending: Emergency Medicine | Admitting: Emergency Medicine

## 2016-12-16 ENCOUNTER — Encounter: Payer: Self-pay | Admitting: Emergency Medicine

## 2016-12-16 DIAGNOSIS — Z79899 Other long term (current) drug therapy: Secondary | ICD-10-CM | POA: Insufficient documentation

## 2016-12-16 DIAGNOSIS — W11XXXA Fall on and from ladder, initial encounter: Secondary | ICD-10-CM | POA: Insufficient documentation

## 2016-12-16 DIAGNOSIS — Y999 Unspecified external cause status: Secondary | ICD-10-CM | POA: Insufficient documentation

## 2016-12-16 DIAGNOSIS — Y939 Activity, unspecified: Secondary | ICD-10-CM | POA: Insufficient documentation

## 2016-12-16 DIAGNOSIS — S4991XA Unspecified injury of right shoulder and upper arm, initial encounter: Secondary | ICD-10-CM | POA: Insufficient documentation

## 2016-12-16 DIAGNOSIS — F17210 Nicotine dependence, cigarettes, uncomplicated: Secondary | ICD-10-CM | POA: Insufficient documentation

## 2016-12-16 DIAGNOSIS — Y929 Unspecified place or not applicable: Secondary | ICD-10-CM | POA: Insufficient documentation

## 2016-12-16 MED ORDER — OXYCODONE-ACETAMINOPHEN 5-325 MG PO TABS
1.0000 | ORAL_TABLET | Freq: Once | ORAL | Status: AC
  Administered 2016-12-16: 1 via ORAL
  Filled 2016-12-16: qty 1

## 2016-12-16 MED ORDER — IBUPROFEN 600 MG PO TABS
600.0000 mg | ORAL_TABLET | Freq: Once | ORAL | Status: AC
  Administered 2016-12-16: 600 mg via ORAL
  Filled 2016-12-16: qty 1

## 2016-12-16 MED ORDER — IBUPROFEN 600 MG PO TABS: 600.0000 mg | ORAL_TABLET | Freq: Four times a day (QID) | ORAL | 0 refills | Status: DC | PRN

## 2016-12-16 MED ORDER — OXYCODONE-ACETAMINOPHEN 5-325 MG PO TABS: 1.0000 | ORAL_TABLET | Freq: Four times a day (QID) | ORAL | 0 refills | Status: AC | PRN

## 2016-12-16 NOTE — ED Triage Notes (Signed)
Pt fell 12 ft off ladder.  Did not hit head. Right shoulder pain. Possible deformity.  Ambulatory to check in. No neck pain

## 2016-12-16 NOTE — ED Notes (Signed)
Pt reports falling yesterday onto right shoulder, states he is having shoulder pain, worse than usual. States he has some rotator cuff issues already related to work, but states pain is worse. Pt states he took hydrocodone around 430-5am today and last ate around 645 this morning. Pt sitting on the side of the pain, appears uncomfortbale and is unable to sit still.

## 2016-12-16 NOTE — Discharge Instructions (Signed)
Return to the ER for any new or worsening pain, weakness or numbness, or any other symptoms that concern you.  [facility] within [facility]-2 weeks.

## 2016-12-16 NOTE — ED Provider Notes (Signed)
[facility] Emergency Department Provider Note ____________________________________________   First MD Initiated Contact with Patient 12/16/16 [phone]     (approximate)  I have reviewed the triage vital signs and the nursing notes.   HISTORY  Chief Complaint Fall    HPI Vernon Hudson is a 36 y.o. male with no significant past medical history who presents with right shoulder pain, acute onset yesterday when he states he fell from a ladder, associated with muscle spasm, not associated with weakness or numbness. Patient denies any other associated injuries. He states he was on the ladder approximately 12 feet up when it fell out from under him and the ladder fell forwards. He thinks he may have reached out with his right arm to stop the fall.   Past Medical History:  Diagnosis Date  . Anxiety   . Heart murmur   . Substance abuse     Patient Active Problem List   Diagnosis Date Noted  . Chronic pain syndrome 11/06/2011  . 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 Depression 11/06/2011  . GAD (generalized anxiety disorder) 11/06/2011  . Nicotine addiction 11/06/2011    Past Surgical History:  Procedure Laterality Date  . FRACTURE SURGERY      Prior to Admission medications   Medication Sig Start Date End Date Taking? Authorizing Provider  azithromycin (ZITHROMAX) 250 MG tablet Take 2 tablets PO on day 1, then take 1 tablet PO daily for 4 more days 06/28/16   Loleta RoseForbach, Cory, MD  cephALEXin (KEFLEX) 500 MG capsule Take 1 capsule (500 mg total) by mouth 3 (three) times daily. 06/28/16   Loleta RoseForbach, Cory, MD  clonazePAM (KLONOPIN) 0.5 MG tablet Take 1 tablet (0.5 mg total) by mouth 2 (two) times daily as needed for anxiety. 02/09/16   Wallis BambergMani, Mario, PA-C  clonazePAM (KLONOPIN) 1 MG tablet Take 1 tablet (1 mg total) by mouth 2 (two) times daily. 02/09/16   Wallis BambergMani, Mario, PA-C  clonazePAM (KLONOPIN) 1 MG tablet Take 1 tablet (1 mg total) by mouth daily.  02/09/16   Wallis BambergMani, Mario, PA-C  hydrOXYzine (VISTARIL) 50 MG capsule Take 1-2 capsules (50-100 mg total) by mouth daily as needed for anxiety (Sleep). 02/09/16   Wallis BambergMani, Mario, PA-C  ibuprofen (ADVIL,MOTRIN) 600 MG tablet Take 1 tablet (600 mg total) by mouth every 6 (six) hours as needed. 12/16/16   Dionne BucySiadecki, Aasir Daigler, MD  oxyCODONE-acetaminophen (ROXICET) 5-325 MG tablet Take 1 tablet by mouth every 6 (six) hours as needed for severe pain. 12/16/16 12/16/17  Dionne BucySiadecki, Cathyann Kilfoyle, MD  sertraline (ZOLOFT) 100 MG tablet Take 1 tablet (100 mg total) by mouth daily. 02/09/16   Wallis BambergMani, Mario, PA-C  sertraline (ZOLOFT) 50 MG tablet Take 1 tablet (50 mg total) by mouth daily. 02/09/16   Wallis BambergMani, Mario, PA-C    Allergies Patient has no known allergies.  Family History  Problem Relation Age of Onset  . Heart disease Mother   . Heart disease Father     Social History Social History  Substance Use Topics  . Smoking status: Current Every Day Smoker    Packs/day: 0.40    Years: 12.00    Types: Cigarettes  . Smokeless tobacco: Former NeurosurgeonUser    Types: Snuff  . Alcohol use No    Review of Systems  Constitutional: No fever/chills Eyes: No visual changes. ENT: No redness Cardiovascular: Denies chest pain. Respiratory: Denies shortness of breath. Gastrointestinal: No nausea, no vomiting.  No diarrhea.  Genitourinary: Negative for dysuria.  Musculoskeletal: Negative  for back pain. Positive for R shoulder pain.  Skin: Negative for rash. Neurological: Negative for headaches, focal weakness or numbness.   ____________________________________________   PHYSICAL EXAM:  VITAL SIGNS: ED Triage Vitals [12/16/16 0901]  Enc Vitals Group     BP (!) 148/107     Pulse Rate 91     Resp 18     Temp 98.3 F (36.8 C)     Temp Source Oral     SpO2 99 %     Weight 165 lb (74.8 kg)     Height  (1.676 m)     Head Circumference      Peak Flow      Pain Score 10     Pain Loc      Pain Edu?      Excl. in GC?       Constitutional: Alert and oriented. Uncomfortable appearing, in no acute distress. Eyes: Conjunctivae are normal.  Head: Atraumatic. Nose: No congestion/rhinnorhea. Mouth/Throat: Mucous membranes are moist.   Neck: Normal range of motion. No cspine tenderness.  Cardiovascular:  Good peripheral circulation.  Chest wall nontender.  Respiratory: Normal respiratory effort.  No rib tenderness.  Gastrointestinal: Soft and nontender. No distention.  Genitourinary: No CVA tenderness. Musculoskeletal: No lower extremity edema.  Extremities warm and well perfused. No midline spinal tenderness.  Right upper extremity with tenderness to the shoulder, and pain on any range of motion, with limited range of motion in abduction. 2+ radial pulses, cap refill less than 2 seconds. Motor and fine touch sensory intact in median, radial, and ulnar distributions. No deformity noted. FROM with no pain at elbow and wrist. Neurologic:  No gross focal neurologic deficits are appreciated.  Skin:  Skin is warm and dry. No rash noted. Psychiatric: Mood and affect are normal. Speech and behavior are normal.  ____________________________________________   LABS (all labs ordered are listed, but only abnormal results are displayed)  Labs Reviewed - No data to display ____________________________________________  EKG   ____________________________________________  RADIOLOGY  Right shoulder and humerus x-rays negative for acute fracture.  ____________________________________________   PROCEDURES  Procedure(s) performed: No    Critical Care performed: No ____________________________________________   INITIAL IMPRESSION / ASSESSMENT AND PLAN / ED COURSE  Pertinent labs & imaging results that were available during my care of the patient were reviewed by me and considered in my medical decision making (see chart for details).  patient presents with right shoulder injury after fall from ladder,  concerning for contusion versus possible rotator cuff injury or other soft tissue injury. Low suspicion for fracture or dislocation based on exam. Neuro/vascular intact. Plan for x-rays, analgesia and reassess.    ----------------------------------------- 11:04 AM on 12/16/2016 -----------------------------------------  Patient's x-rays negative and his pain is controlled. Patient counseled about the importance of follow-up with orthopedics for reassessment and potential further outpatient imaging if needed. Patient states he does not have insurance however he understands he can follow-up at [facility] clinic for her first visit without upfront payment. I also instructed the patient that he could follow-up at [facility] if need be.  Return precautions given. Will give sling for comfort; patient encouraged to take the arm out of sling several times per day and range it to prevent stiffness and wasting.  ____________________________________________   FINAL CLINICAL IMPRESSION(S) / ED DIAGNOSES  Final diagnoses:  Injury of right shoulder, initial encounter      NEW MEDICATIONS STARTED DURING THIS VISIT:  Discharge Medication List as of 12/16/2016 11:09  AM    START taking these medications   Details  ibuprofen (ADVIL,MOTRIN) 600 MG tablet Take 1 tablet (600 mg total) by mouth every 6 (six) hours as needed., Starting Sun 12/16/2016, Print    oxyCODONE-acetaminophen (ROXICET) 5-325 MG tablet Take 1 tablet by mouth every 6 (six) hours as needed for severe pain., Starting Sun 12/16/2016, Until Mon 12/16/2017, Print         Note:  This document was prepared using Dragon voice recognition software and may include unintentional dictation errors.    Dionne Bucy, MD 12/16/16 [phone]

## 2017-02-17 ENCOUNTER — Other Ambulatory Visit: Payer: Self-pay | Admitting: Urgent Care

## 2017-08-28 ENCOUNTER — Emergency Department: Payer: Self-pay

## 2017-08-28 ENCOUNTER — Emergency Department
Admission: EM | Admit: 2017-08-28 | Discharge: 2017-08-28 | Disposition: A | Discharge status: Home / Self Care | Payer: Self-pay | Attending: Emergency Medicine | Admitting: Emergency Medicine

## 2017-08-28 ENCOUNTER — Other Ambulatory Visit: Payer: Self-pay

## 2017-08-28 DIAGNOSIS — F17210 Nicotine dependence, cigarettes, uncomplicated: Secondary | ICD-10-CM | POA: Insufficient documentation

## 2017-08-28 DIAGNOSIS — F419 Anxiety disorder, unspecified: Secondary | ICD-10-CM | POA: Insufficient documentation

## 2017-08-28 DIAGNOSIS — Z79899 Other long term (current) drug therapy: Secondary | ICD-10-CM | POA: Insufficient documentation

## 2017-08-28 DIAGNOSIS — R0789 Other chest pain: Secondary | ICD-10-CM | POA: Insufficient documentation

## 2017-08-28 LAB — CBC
HCT: 43 % (ref 40.0–52.0)
Hemoglobin: 15.3 g/dL (ref 13.0–18.0)
MCH: 33 pg (ref 26.0–34.0)
MCHC: 35.6 g/dL (ref 32.0–36.0)
MCV: 92.7 fL (ref 80.0–100.0)
Platelets: 255 10*3/uL (ref 150–440)
RBC: 4.64 MIL/uL (ref 4.40–5.90)
RDW: 11.6 % (ref 11.5–14.5)
WBC: 10.7 10*3/uL — ABNORMAL HIGH (ref 3.8–10.6)

## 2017-08-28 LAB — BASIC METABOLIC PANEL WITH GFR
Anion gap: 10 (ref 5–15)
BUN: 14 mg/dL (ref 6–20)
CO2: 27 mmol/L (ref 22–32)
Calcium: 9.6 mg/dL (ref 8.9–10.3)
Chloride: 99 mmol/L — ABNORMAL LOW (ref 101–111)
Creatinine, Ser: 0.81 mg/dL (ref 0.61–1.24)
GFR calc Af Amer: >60 mL/min
GFR calc non Af Amer: >60 mL/min
Glucose, Bld: 101 mg/dL — ABNORMAL HIGH (ref 65–99)
Potassium: 3.6 mmol/L (ref 3.5–5.1)
Sodium: 136 mmol/L (ref 135–145)

## 2017-08-28 LAB — TROPONIN I: Troponin I: <0.03 ng/mL (ref <0.03)

## 2017-08-28 LAB — FIBRIN DERIVATIVES D-DIMER (ARMC ONLY): Fibrin derivatives D-dimer (ARMC): 229.85 ng{FEU}/mL (ref 0.00–499.00)

## 2017-08-28 MED ORDER — LORAZEPAM 2 MG/ML IJ SOLN
2.0000 mg | Freq: Once | INTRAMUSCULAR | Status: AC
  Administered 2017-08-28: 2 mg via INTRAVENOUS
  Filled 2017-08-28: qty 1

## 2017-08-28 NOTE — Discharge Instructions (Signed)
The breathing exercises we talked about if you have severe pain, pain that is different from your chronic pain, shortness of breath or you feel worse in any way return to the emergency room.  Follow closely with primary care doctor as listed above, as well as cardiologist listed above and [facility] services.

## 2017-08-28 NOTE — ED Provider Notes (Addendum)
[facility] Emergency Department Provider Note  ____________________________________________   I have reviewed the triage vital signs and the nursing notes. Where available I have reviewed prior notes and, if possible and indicated, outside hospital notes.    HISTORY  Chief Complaint Chest Pain    HPI Vernon Hudson is a 37 y.o. male with a history of polysubstance abuse, chronic pain syndrome, he used to be on methadone but no longer is, and significant anxiety with panic attacks states that he is having chest pain and anxiety.  Patient states that this happens to him every couple months.  Do not ever tell him why it is happening.  He states that it is comes along when he is very stressed about life or work.  He has no SI or HI but is been very very stressed recently and then today began having chest pain and hyperventilation.  His significant other who is with him feels that this is a panic attack.  Patient is very anxious and upset.  He does state that this is usually brought about by anxiety.  He denies any personal family history of PE or DVT he denies any recent travel leg swelling or significant shortness of breath.  He states that everything hurts when this is happening including his chest.  Nonradiating, he says that the entire body seems to hurt.  He denies any exertional symptoms he does work long hours with no difficulty or shortness of breath or chest pain generally speaking.      Past Medical History:  Diagnosis Date  . Anxiety   . Heart murmur   . Substance abuse [facility])     Patient Active Problem List   Diagnosis Date Noted  . Chronic pain syndrome 11/06/2011  . 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 Depression 11/06/2011  . GAD (generalized anxiety disorder) 11/06/2011  . Nicotine addiction 11/06/2011    Past Surgical History:  Procedure Laterality Date  . FRACTURE SURGERY      Prior to Admission medications    Medication Sig Start Date End Date Taking? Authorizing Provider  azithromycin (ZITHROMAX) 250 MG tablet Take 2 tablets PO on day 1, then take 1 tablet PO daily for 4 more days 06/28/16   Loleta Rose, MD  cephALEXin (KEFLEX) 500 MG capsule Take 1 capsule (500 mg total) by mouth 3 (three) times daily. 06/28/16   Loleta Rose, MD  clonazePAM (KLONOPIN) 0.5 MG tablet Take 1 tablet (0.5 mg total) by mouth 2 (two) times daily as needed for anxiety. 02/09/16   Wallis Bamberg, PA-C  clonazePAM (KLONOPIN) 1 MG tablet Take 1 tablet (1 mg total) by mouth 2 (two) times daily. 02/09/16   Wallis Bamberg, PA-C  clonazePAM (KLONOPIN) 1 MG tablet Take 1 tablet (1 mg total) by mouth daily. 02/09/16   Wallis Bamberg, PA-C  hydrOXYzine (VISTARIL) 50 MG capsule Take 1-2 capsules (50-100 mg total) by mouth daily as needed for anxiety (Sleep). 02/09/16   Wallis Bamberg, PA-C  ibuprofen (ADVIL,MOTRIN) 600 MG tablet Take 1 tablet (600 mg total) by mouth every 6 (six) hours as needed. 12/16/16   Dionne Bucy, MD  oxyCODONE-acetaminophen (ROXICET) 5-325 MG tablet Take 1 tablet by mouth every 6 (six) hours as needed for severe pain. 12/16/16 12/16/17  Dionne Bucy, MD  sertraline (ZOLOFT) 100 MG tablet Take 1 tablet (100 mg total) daily by mouth. Office visit needed for refills 02/17/17   Wallis Bamberg, PA-C    Allergies Patient has no known allergies.  Family History  Problem Relation Age of Onset  . Heart disease Mother   . Heart disease Father     Social History Social History   Tobacco Use  . Smoking status: Current Every Day Smoker    Packs/day: 0.40    Years: 12.00    Pack years: 4.80    Types: Cigarettes  . Smokeless tobacco: Former Neurosurgeon    Types: Snuff  Substance Use Topics  . Alcohol use: No  . Drug use: No    Review of Systems Constitutional: No fever/chills Eyes: No visual changes. ENT: No sore throat. No stiff neck no neck pain Cardiovascular: See HPI Respiratory: Denies shortness of  breath. Gastrointestinal:   no vomiting.  No diarrhea.  No constipation. Genitourinary: Negative for dysuria. Musculoskeletal: Negative lower extremity swelling Skin: Negative for rash. Neurological: Negative for severe headaches, focal weakness or numbness.   ____________________________________________   PHYSICAL EXAM:  VITAL SIGNS: ED Triage Vitals  Enc Vitals Group     BP 08/28/17 1541 (!) 141/94     Pulse Rate 08/28/17 1541 (!) 101     Resp 08/28/17 1541 20     Temp 08/28/17 1541 99 F (37.2 C)     Temp Source 08/28/17 1541 Oral     SpO2 08/28/17 1541 100 %     Weight 08/28/17 1542 165 lb (74.8 kg)     Height 08/28/17 1542  (1.676 m)     Head Circumference --      Peak Flow --      Pain Score 08/28/17 1542 9     Pain Loc --      Pain Edu? --      Excl. in GC? --     Constitutional: Alert and oriented.  Very very anxious, bending over and shaking tapping his foot and looks quite anxious medically speaking he is nontoxic Eyes: Conjunctivae are normal Head: Atraumatic HEENT: No congestion/rhinnorhea. Mucous membranes are moist.  Oropharynx non-erythematous Neck:   Nontender with no meningismus, no masses, no stridor Cardiovascular: Normal rate, regular rhythm. Grossly normal heart sounds.  Good peripheral circulation. Respiratory: Normal respiratory effort.  No retractions. Lungs CTAB. Abdominal: Soft and nontender. No distention. No guarding no rebound Back:  There is no focal tenderness or step off.  there is no midline tenderness there are no lesions noted. there is no CVA tenderness Musculoskeletal: No lower extremity tenderness, no upper extremity tenderness. No joint effusions, no DVT signs strong distal pulses no edema Neurologic:  Normal speech and language. No gross focal neurologic deficits are appreciated.  Skin:  Skin is warm, dry and intact. No rash noted. Psychiatric: Mood and affect are anxious. Speech and behavior are  normal.  ____________________________________________   LABS (all labs ordered are listed, but only abnormal results are displayed)  Labs Reviewed  BASIC METABOLIC PANEL - Abnormal; Notable for the following components:      Result Value   Chloride 99 (*)    Glucose, Bld 101 (*)    All other components within normal limits  CBC - Abnormal; Notable for the following components:   WBC 10.7 (*)    All other components within normal limits  TROPONIN I  FIBRIN DERIVATIVES D-DIMER [facility] ONLY)    Pertinent labs  results that were available during my care of the patient were reviewed by me and considered in my medical decision making (see chart for details). ____________________________________________  EKG  I personally interpreted any EKGs ordered by me or triage Sinus  tach rate 104 no acute ST elevation or depression normal axis unremarkable EKG ____________________________________________  RADIOLOGY  Pertinent labs & imaging results that were available during my care of the patient were reviewed by me and considered in my medical decision making (see chart for details). If possible, patient and/or family made aware of any abnormal findings.  Dg Chest 2 View  Result Date: 08/28/2017 CLINICAL DATA:  Chest pain and shortness of breath since this morning, former smoker, history heart murmur EXAM: CHEST - 2 VIEW COMPARISON:  06/27/2016 FINDINGS: Normal heart size, mediastinal contours, and pulmonary vascularity. Lungs clear. No pleural effusion or pneumothorax. Bones unremarkable. IMPRESSION: Normal exam. Electronically Signed   By: Ulyses Southward M.D.   On: 08/28/2017 16:15   ____________________________________________    PROCEDURES  Procedure(s) performed: None  Procedures  Critical Care performed: None  ____________________________________________   INITIAL IMPRESSION / ASSESSMENT AND PLAN / ED COURSE  Pertinent labs & imaging results that were available during my care of  the patient were reviewed by me and considered in my medical decision making (see chart for details).  Patient here feeling very anxious and having diffuse body aches as a result.  He and I spent some time together breathing gently and as he was able to get his breathing under better control, his pain began to gently subside.  He is to be on benzos but he no longer takes them.  He apparently weaned himself off because he was worried about it.  In any event, history and physical are very consistent with anxiety induced discomfort however, given that he does also have some pleuritic issues today we will obtain a d-dimer.  Low suspicion for PE but he is slightly tachycardic upon arrival and cannot be around [facility] negative as a result in any event, if that is negative is my hope we can get him safely home.  He has a negative troponin despite having symptoms since this morning after he showered and began to face the day at work.  Did consider ACS PE dissection myocarditis endocarditis peritonitis pneumonia pneumothorax, endocarditis pericarditis, pericardial effusion among other entities, and see no compelling evidence for any of these entities at this time  ----------------------------------------- 9:15 PM on 08/28/2017 -----------------------------------------  Ativan breathing exercises patient states his pain is nearly gone.  According to him and his significant other is been going on for 4 years only when he is anxious, and he went off his Zoloft 6 months ago and since that time the anxiety has been getting worse since over the attacks.  He has no SI or HI does not want to stay in the hospital, I do not see any primarily organic pathology here to the extent that I can rule out in the emergency room I have explained to him the limitations of our work-up and the need for close outpatient follow-up.  We will have him follow-up with a cardiologist so that he can get a stress test and alleviate some of his  concerns about this.  In addition, we will have him follow-up with primary care and [facility].  Patient very well-appearing vital signs and findings are all reassuring we will have him home with return precautions and follow-up.    ____________________________________________   FINAL CLINICAL IMPRESSION(S) / ED DIAGNOSES  Final diagnoses:  None      This chart was dictated using voice recognition software.  Despite best efforts to proofread,  errors can occur which can change meaning.      Kathrin Folden,  Rudy Jew, MD 08/28/17 1926    Jeanmarie Plant, MD 08/28/17 2116

## 2017-08-28 NOTE — ED Triage Notes (Signed)
Pt c/o substernal chest pain with SOB since this morning. States he has had 2 other episodes with the same sx and was seen here and released with no Dx..pt is in NAD on arrival.

## 2017-08-28 NOTE — ED Triage Notes (Signed)
FIRST NURSE NOTE-here for CP. Pulled for EKG

## 2018-03-29 ENCOUNTER — Encounter: Payer: Self-pay | Admitting: Medical Oncology

## 2018-03-29 ENCOUNTER — Emergency Department
Admission: EM | Admit: 2018-03-29 | Discharge: 2018-03-29 | Disposition: A | Discharge status: Home / Self Care | Payer: Self-pay | Attending: Emergency Medicine | Admitting: Emergency Medicine

## 2018-03-29 DIAGNOSIS — F419 Anxiety disorder, unspecified: Secondary | ICD-10-CM | POA: Insufficient documentation

## 2018-03-29 DIAGNOSIS — Z79899 Other long term (current) drug therapy: Secondary | ICD-10-CM | POA: Insufficient documentation

## 2018-03-29 DIAGNOSIS — F17210 Nicotine dependence, cigarettes, uncomplicated: Secondary | ICD-10-CM | POA: Insufficient documentation

## 2018-03-29 DIAGNOSIS — R1013 Epigastric pain: Secondary | ICD-10-CM | POA: Insufficient documentation

## 2018-03-29 DIAGNOSIS — F329 Major depressive disorder, single episode, unspecified: Secondary | ICD-10-CM | POA: Insufficient documentation

## 2018-03-29 LAB — CBC
HCT: 45.1 % (ref 39.0–52.0)
Hemoglobin: 15.5 g/dL (ref 13.0–17.0)
MCH: 31.9 pg (ref 26.0–34.0)
MCHC: 34.4 g/dL (ref 30.0–36.0)
MCV: 92.8 fL (ref 80.0–100.0)
NRBC: 0 % (ref 0.0–0.2)
PLATELETS: 329 10*3/uL (ref 150–400)
RBC: 4.86 MIL/uL (ref 4.22–5.81)
RDW: 11.2 % — AB (ref 11.5–15.5)
WBC: 5.7 10*3/uL (ref 4.0–10.5)

## 2018-03-29 LAB — COMPREHENSIVE METABOLIC PANEL
ALT: 46 U/L — ABNORMAL HIGH (ref 0–44)
ANION GAP: 10 (ref 5–15)
AST: 43 U/L — AB (ref 15–41)
Albumin: 4.6 g/dL (ref 3.5–5.0)
Alkaline Phosphatase: 47 U/L (ref 38–126)
BUN: 18 mg/dL (ref 6–20)
CHLORIDE: 102 mmol/L (ref 98–111)
CO2: 26 mmol/L (ref 22–32)
Calcium: 9.6 mg/dL (ref 8.9–10.3)
Creatinine, Ser: 0.79 mg/dL (ref 0.61–1.24)
Glucose, Bld: 114 mg/dL — ABNORMAL HIGH (ref 70–99)
POTASSIUM: 4 mmol/L (ref 3.5–5.1)
Sodium: 138 mmol/L (ref 135–145)
Total Bilirubin: 1 mg/dL (ref 0.3–1.2)
Total Protein: 8.1 g/dL (ref 6.5–8.1)

## 2018-03-29 LAB — TROPONIN I

## 2018-03-29 LAB — LIPASE, BLOOD: LIPASE: 31 U/L (ref 11–51)

## 2018-03-29 MED ORDER — ALUM & MAG HYDROXIDE-SIMETH 200-200-20 MG/5ML PO SUSP
30.0000 mL | Freq: Once | ORAL | Status: AC
  Administered 2018-03-29: 30 mL via ORAL
  Filled 2018-03-29: qty 30

## 2018-03-29 MED ORDER — LIDOCAINE VISCOUS HCL 2 % MT SOLN
15.0000 mL | Freq: Once | OROMUCOSAL | Status: AC
  Administered 2018-03-29: 15 mL via ORAL
  Filled 2018-03-29: qty 15

## 2018-03-29 MED ORDER — PANTOPRAZOLE SODIUM 40 MG PO TBEC: 40.0000 mg | DELAYED_RELEASE_TABLET | Freq: Every day | ORAL | 1 refills | Status: DC

## 2018-03-29 NOTE — ED Provider Notes (Signed)
[facility] Emergency Department Provider Note  Time seen: 7:55 AM  I have reviewed the triage vital signs and the nursing notes.   HISTORY  Chief Complaint Abdominal Pain    HPI Vernon Hudson is a 37 y.o. male with a past medical history of anxiety, chronic pain, presents to the emergency department for epigastric discomfort.  According to the patient for the past 2 months he has had a dull ache in the upper abdomen.  States he feels like it is getting worse.  Denies any sharp pain to the area.  Denies any nausea vomiting or diarrhea.  No chest pain or trouble breathing.  States he thinks it gets somewhat worse when he eats.  Denies any urinary symptoms.  No fever.  Largely negative review of systems.   Past Medical History:  Diagnosis Date  . Anxiety   . Heart murmur   . Substance abuse [facility](HCC)     Patient Active Problem List   Diagnosis Date Noted  . Chronic pain syndrome 11/06/2011  . 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 Depression 11/06/2011  . GAD (generalized anxiety disorder) 11/06/2011  . Nicotine addiction 11/06/2011    Past Surgical History:  Procedure Laterality Date  . FRACTURE SURGERY      Prior to Admission medications   Medication Sig Start Date End Date Taking? Authorizing Provider  azithromycin (ZITHROMAX) 250 MG tablet Take 2 tablets PO on day 1, then take 1 tablet PO daily for 4 more days 06/28/16   Loleta RoseForbach, Cory, MD  cephALEXin (KEFLEX) 500 MG capsule Take 1 capsule (500 mg total) by mouth 3 (three) times daily. 06/28/16   Loleta RoseForbach, Cory, MD  clonazePAM (KLONOPIN) 0.5 MG tablet Take 1 tablet (0.5 mg total) by mouth 2 (two) times daily as needed for anxiety. 02/09/16   Wallis BambergMani, Mario, PA-C  clonazePAM (KLONOPIN) 1 MG tablet Take 1 tablet (1 mg total) by mouth 2 (two) times daily. 02/09/16   Wallis BambergMani, Mario, PA-C  clonazePAM (KLONOPIN) 1 MG tablet Take 1 tablet (1 mg total) by mouth daily. 02/09/16   Wallis BambergMani, Mario, PA-C   hydrOXYzine (VISTARIL) 50 MG capsule Take 1-2 capsules (50-100 mg total) by mouth daily as needed for anxiety (Sleep). 02/09/16   Wallis BambergMani, Mario, PA-C  ibuprofen (ADVIL,MOTRIN) 600 MG tablet Take 1 tablet (600 mg total) by mouth every 6 (six) hours as needed. 12/16/16   Dionne BucySiadecki, Sebastian, MD  sertraline (ZOLOFT) 100 MG tablet Take 1 tablet (100 mg total) daily by mouth. Office visit needed for refills 02/17/17   Wallis BambergMani, Mario, PA-C    No Known Allergies  Family History  Problem Relation Age of Onset  . Heart disease Mother   . Heart disease Father     Social History Social History   Tobacco Use  . Smoking status: Current Every Day Smoker    Packs/day: 0.40    Years: 12.00    Pack years: 4.80    Types: Cigarettes  . Smokeless tobacco: Former NeurosurgeonUser    Types: Snuff  Substance Use Topics  . Alcohol use: No  . Drug use: No    Review of Systems Constitutional: Negative for fever. Cardiovascular: Negative for chest pain. Respiratory: Negative for shortness of breath. Gastrointestinal: Upper abdominal pain.  Negative for nausea vomiting diarrhea Genitourinary: Negative for urinary compaints Musculoskeletal: Negative for musculoskeletal complaints Skin: Negative for skin complaints  Neurological: Negative for headache All other ROS negative  ____________________________________________   PHYSICAL EXAM:  VITAL SIGNS: ED Triage  Vitals  Enc Vitals Group     BP 03/29/18 0752 (!) 146/88     Pulse Rate 03/29/18 0752 98     Resp 03/29/18 0752 18     Temp 03/29/18 0755 98.1 F (36.7 C)     Temp Source 03/29/18 0755 Oral     SpO2 03/29/18 0752 98 %     Weight 03/29/18 0748 155 lb (70.3 kg)     Height 03/29/18 0748 5\' 8"  (1.727 m)     Head Circumference --      Peak Flow --      Pain Score 03/29/18 0747 5     Pain Loc --      Pain Edu? --      Excl. in GC? --    Constitutional: Alert and oriented. Well appearing and in no distress. Eyes: Normal exam ENT   Head:  Normocephalic and atraumatic.   Mouth/Throat: Mucous membranes are moist. Cardiovascular: Normal rate, regular rhythm. No murmur Respiratory: Normal respiratory effort without tachypnea nor retractions. Breath sounds are clear Gastrointestinal: Soft, mild epigastric tenderness palpation no rebound guarding or distention.  No CVA tenderness. Musculoskeletal: Nontender with normal range of motion in all extremities. Neurologic:  Normal speech and language. No gross focal neurologic deficits Skin:  Skin is warm, dry and intact.  Psychiatric: Mood and affect are normal.   ____________________________________________    EKG  EKG viewed and interpreted by myself shows a normal sinus rhythm at 77 bpm with a narrow QRS, normal axis, normal intervals, no concerning ST changes.  ____________________________________________    INITIAL IMPRESSION / ASSESSMENT AND PLAN / ED COURSE  Pertinent labs & imaging results that were available during my care of the patient were reviewed by me and considered in my medical decision making (see chart for details).  Patient presents to the emergency department for upper abdominal discomfort/epigastric discomfort over the past 2 months.  Differential would include gastritis, gastric/peptic ulcers, pancreatitis, gallbladder pathology, hepatic steatosis or other liver pathology.  Patient has very minimal epigastric tenderness otherwise benign abdomen.  Patient states he only came today because it is starting to interfere with his work, states he can feel the discomfort when he moves his body.  We will check labs, dose a GI cocktail and continue to closely monitor.  Labs are largely within normal limits.  Lipase is normal slight LFT elevation.  Patient does admit to daily alcohol use.  I discussed the likelihood of gastritis I also discussed the need to follow-up with GI medicine for an endoscopy.  We will prescribe Protonix for the patient.  He will call GI to  arrange a follow-up appointment.  ____________________________________________   FINAL CLINICAL IMPRESSION(S) / ED DIAGNOSES  Epigastric pain    Minna AntisPaduchowski, Armando Lauman, MD 03/29/18 [id number]

## 2018-03-29 NOTE — Discharge Instructions (Addendum)
Please fill and begin taking your medication as prescribed each day for the next 2 months.  You may also use over-the-counter Maalox to help with symptom relief.  Please avoid alcohol use, please avoid NSAIDs such as aspirin, Motrin, ibuprofen.  Please call the number provided to arrange a follow-up appointment with GI medicine for further evaluation and possible endoscopy.

## 2018-03-29 NOTE — ED Triage Notes (Signed)
Pt reports epigastric pain for a couple of months but has worsened over past few days. Denies NVD.

## 2019-07-05 ENCOUNTER — Emergency Department: Payer: Self-pay

## 2019-07-05 ENCOUNTER — Other Ambulatory Visit: Payer: Self-pay

## 2019-07-05 ENCOUNTER — Emergency Department
Admission: EM | Admit: 2019-07-05 | Discharge: 2019-07-06 | Disposition: A | Discharge status: Home / Self Care | Payer: Self-pay | Attending: Student | Admitting: Student

## 2019-07-05 DIAGNOSIS — Z87891 Personal history of nicotine dependence: Secondary | ICD-10-CM | POA: Insufficient documentation

## 2019-07-05 DIAGNOSIS — Y92018 Other place in single-family (private) house as the place of occurrence of the external cause: Secondary | ICD-10-CM | POA: Insufficient documentation

## 2019-07-05 DIAGNOSIS — W540XXA Bitten by dog, initial encounter: Secondary | ICD-10-CM | POA: Insufficient documentation

## 2019-07-05 DIAGNOSIS — Z23 Encounter for immunization: Secondary | ICD-10-CM | POA: Insufficient documentation

## 2019-07-05 DIAGNOSIS — Y998 Other external cause status: Secondary | ICD-10-CM | POA: Insufficient documentation

## 2019-07-05 DIAGNOSIS — Y93K9 Activity, other involving animal care: Secondary | ICD-10-CM | POA: Insufficient documentation

## 2019-07-05 DIAGNOSIS — S0185XA Open bite of other part of head, initial encounter: Secondary | ICD-10-CM | POA: Insufficient documentation

## 2019-07-05 MED ORDER — LIDOCAINE HCL (PF) 1 % IJ SOLN
INTRAMUSCULAR | Status: AC
  Filled 2019-07-05: qty 5

## 2019-07-05 MED ORDER — LIDOCAINE HCL (PF) 1 % IJ SOLN: 30.0000 mL | Freq: Once | INTRAMUSCULAR | Status: DC

## 2019-07-05 MED ORDER — TETANUS-DIPHTH-ACELL PERTUSSIS 5-2.5-18.5 LF-MCG/0.5 IM SUSP
0.5000 mL | Freq: Once | INTRAMUSCULAR | Status: AC
  Administered 2019-07-05: 23:00:00 0.5 mL via INTRAMUSCULAR
  Filled 2019-07-05: qty 0.5

## 2019-07-05 MED ORDER — SODIUM CHLORIDE 0.9 % IV SOLN
3.0000 g | Freq: Once | INTRAVENOUS | Status: AC
  Administered 2019-07-05: 3 g via INTRAVENOUS
  Filled 2019-07-05 (×2): qty 8

## 2019-07-05 MED ORDER — LIDOCAINE HCL (PF) 1 % IJ SOLN
INTRAMUSCULAR | Status: AC
  Filled 2019-07-05: qty 15

## 2019-07-05 NOTE — ED Triage Notes (Signed)
Pt reports being bit by dog on face, over lips. Pt reports lacerations puncturing through to the gums inside the mouth. Pt has bleeding controlled. 4 lacerations noted over top lip in varying sizes.  Pt states his own dog bit him, states dog is up to date on vaccinations.

## 2019-07-05 NOTE — ED Notes (Signed)
Patient to CT via stretcher.

## 2019-07-06 MED ORDER — MUPIROCIN 2 % EX OINT: TOPICAL_OINTMENT | CUTANEOUS | 0 refills | Status: AC

## 2019-07-06 MED ORDER — KETOROLAC TROMETHAMINE 30 MG/ML IJ SOLN
30.0000 mg | Freq: Once | INTRAMUSCULAR | Status: AC
  Administered 2019-07-06: 30 mg via INTRAVENOUS

## 2019-07-06 MED ORDER — AMOXICILLIN-POT CLAVULANATE 875-125 MG PO TABS: 1.0000 | ORAL_TABLET | Freq: Two times a day (BID) | ORAL | 0 refills | Status: AC

## 2019-07-06 MED ORDER — IBUPROFEN 600 MG PO TABS: 600.0000 mg | ORAL_TABLET | Freq: Four times a day (QID) | ORAL | 0 refills | Status: DC | PRN

## 2019-07-06 MED ORDER — KETOROLAC TROMETHAMINE 30 MG/ML IJ SOLN
INTRAMUSCULAR | Status: AC
  Filled 2019-07-06: qty 1

## 2019-07-06 NOTE — ED Provider Notes (Signed)
[facility] Emergency Department Provider Note  ____________________________________________   First MD Initiated Contact with Patient 07/05/19 2224     (approximate)  I have reviewed the triage vital signs and the nursing notes.  History  Chief Complaint Animal Bite    HPI Vernon Hudson is a 39 y.o. male who presents for dog bite to face. Patient bit by his own dog, vaccinations up to date. States the dog has a history of labile temperament and got upset that he was giving attention to another dog. He has multiple lacerations to the face and swollen upper lip. No difficulty breathing, no visual changes. Pain to the laceration sites, throbbing, moderate in severity, constant since onset. Worse with movement and examination. Believes last tetanus was w/in last 10 years, unsure if in last 5. Denies any other injuries aside from those to his face.    Past Medical Hx Past Medical History:  Diagnosis Date  . Anxiety   . Heart murmur   . Substance abuse [facility])     Problem List Patient Active Problem List   Diagnosis Date Noted  . Chronic pain syndrome 11/06/2011  . 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 Depression 11/06/2011  . GAD (generalized anxiety disorder) 11/06/2011  . Nicotine addiction 11/06/2011    Past Surgical Hx Past Surgical History:  Procedure Laterality Date  . FRACTURE SURGERY      Medications Prior to Admission medications   Medication Sig Start Date End Date Taking? Authorizing Provider  azithromycin (ZITHROMAX) 250 MG tablet Take 2 tablets PO on day 1, then take 1 tablet PO daily for 4 more days 06/28/16   Hinda Kehr, MD  cephALEXin (KEFLEX) 500 MG capsule Take 1 capsule (500 mg total) by mouth 3 (three) times daily. 06/28/16   Hinda Kehr, MD  clonazePAM (KLONOPIN) 0.5 MG tablet Take 1 tablet (0.5 mg total) by mouth 2 (two) times daily as needed for anxiety. 02/09/16   Jaynee Eagles, PA-C  clonazePAM  (KLONOPIN) 1 MG tablet Take 1 tablet (1 mg total) by mouth 2 (two) times daily. 02/09/16   Jaynee Eagles, PA-C  clonazePAM (KLONOPIN) 1 MG tablet Take 1 tablet (1 mg total) by mouth daily. 02/09/16   Jaynee Eagles, PA-C  hydrOXYzine (VISTARIL) 50 MG capsule Take 1-2 capsules (50-100 mg total) by mouth daily as needed for anxiety (Sleep). 02/09/16   Jaynee Eagles, PA-C  ibuprofen (ADVIL,MOTRIN) 600 MG tablet Take 1 tablet (600 mg total) by mouth every 6 (six) hours as needed. 12/16/16   Arta Silence, MD  pantoprazole (PROTONIX) 40 MG tablet Take 1 tablet (40 mg total) by mouth daily. 03/29/18 03/29/19  Harvest Dark, MD  sertraline (ZOLOFT) 100 MG tablet Take 1 tablet (100 mg total) daily by mouth. Office visit needed for refills 02/17/17   Jaynee Eagles, PA-C    Allergies Patient has no known allergies.  Family Hx Family History  Problem Relation Age of Onset  . Heart disease Mother   . Heart disease Father     Social Hx Social History   Tobacco Use  . Smoking status: Former Smoker    Packs/day: 0.40    Years: 12.00    Pack years: 4.80    Types: Cigarettes  . Smokeless tobacco: Former Systems developer    Types: Snuff  Substance Use Topics  . Alcohol use: No  . Drug use: No     Review of Systems  Constitutional: Negative for fever. Negative for chills. Eyes: Negative for visual  changes. ENT: Negative for sore throat. Cardiovascular: Negative for chest pain. Respiratory: Negative for shortness of breath. Gastrointestinal: Negative for nausea. Negative for vomiting.  Genitourinary: Negative for dysuria. Musculoskeletal: Negative for leg swelling. Skin: Negative for rash. + multiple facial lacerations Neurological: Negative for headaches.   Physical Exam  Vital Signs: ED Triage Vitals  Enc Vitals Group     BP 07/05/19 2121 124/87     Pulse Rate 07/05/19 2121 81     Resp 07/05/19 2121 20     Temp 07/05/19 2121 98.1 F (36.7 C)     Temp Source 07/05/19 2121 Oral     SpO2  07/05/19 2121 98 %     Weight 07/05/19 2121 151 lb (68.5 kg)     Height 07/05/19 2121 5\' 7"  (1.702 m)     Head Circumference --      Peak Flow --      Pain Score 07/05/19 2124 2     Pain Loc --      Pain Edu? --      Excl. in [place]? --     Constitutional: Alert and oriented.  Head: Multiple facial lacerations as described below.  Eyes: Conjunctivae clear. Sclera anicteric. Pupils equal and symmetric. Full EOMI w/o evidence of entrapment.  Nose: No masses or lesions. No congestion or rhinorrhea. No epistaxis. No nasal septal hematomas.  Mouth/Throat: No intra-oral or dental trauma. Swelling and developing ecchymosis of the upper lip. No intra-oral lacerations. Lacerations are NOT through and through.  Neck: No stridor. Trachea midline.  Cardiovascular: Normal rate. Extremities well perfused. Respiratory: Normal respiratory effort. Genitourinary: Deferred. Musculoskeletal: No lower extremity edema. No deformities. Neurologic:  Normal speech and language. No gross focal or lateralizing neurologic deficits are appreciated.  Skin: Multiple lacerations/abrasions to the face.  #1 - 4 cm horizontal laceration to the R cheek. No exposed bone. Slow oozing.  #2 - superficial, well approximated, horizontal linear break in skin to the left cheek, ~ 2 cm in length, does not require repair #3 - superficial, well approximated, horizontal linear break in skin to the left cheek, ~ 2 cm in length, just inferior to laceration #2 and also does not require repair #4 - 6-7 cm irregular laceration superior to the left sided upper lip (does not involve vermilion border) which extends from the lateral border of the lip and travels medially though the philtrum and thru the base of the columella at the nasolabial angle. No cartilage exposure. No damage to the nasal septum. This is not through and through and does not involve the internal mucosa.  Psychiatric: Mood and affect are appropriate for situation.  EKG  N/A     Radiology   CT  IMPRESSION:  1. Soft tissue swelling and laceration left perioral region.  2. No fractures or radiopaque foreign bodies.    Procedures  Procedure(s) performed (including critical care):  [id number] KitchenLaceration Repair  Date/Time: 07/06/2019 12:56 AM Performed by: 07/08/2019., MD Authorized by: Miguel Aschoff., MD   Consent:    Consent obtained:  Verbal   Consent given by:  Patient   Risks discussed:  Infection, poor cosmetic result, poor wound healing and pain Anesthesia (see MAR for exact dosages):    Anesthesia method:  Local infiltration   Local anesthetic:  Lidocaine 1% w/o epi Laceration details:    Location:  Face   Face location:  R cheek   Length (cm):  4 Repair type:    Repair type:  Intermediate Pre-procedure details:  Preparation:  Patient was prepped and draped in usual sterile fashion and imaging obtained to evaluate for foreign bodies Exploration:    Wound exploration: wound explored through full range of motion and entire depth of wound probed and visualized     Wound extent: no foreign bodies/material noted and no underlying fracture noted   Treatment:    Area cleansed with:  Betadine and soap and water   Amount of cleaning:  Extensive Skin repair:    Repair method:  Sutures   Suture size:  6-0   Suture material:  Prolene   Suture technique:  Simple interrupted   Number of sutures:  6 Post-procedure details:    Dressing:  Open (no dressing)   Patient tolerance of procedure:  Tolerated well, no immediate complications .Marland KitchenLaceration Repair  Date/Time: 07/06/2019 12:56 AM Performed by: Miguel Aschoff., MD Authorized by: Miguel Aschoff., MD   Consent:    Consent obtained:  Verbal   Consent given by:  Patient   Risks discussed:  Infection, pain, poor cosmetic result and poor wound healing   Alternatives discussed:  No treatment Anesthesia (see MAR for exact dosages):    Anesthesia method:  Local infiltration   Local anesthetic:   Lidocaine 1% w/o epi Laceration details:    Location:  Face   Face location:  L cheek   Length (cm):  7 Repair type:    Repair type:  Intermediate Pre-procedure details:    Preparation:  Patient was prepped and draped in usual sterile fashion and imaging obtained to evaluate for foreign bodies Exploration:    Hemostasis achieved with:  Direct pressure   Wound exploration: wound explored through full range of motion and entire depth of wound probed and visualized     Wound extent: no foreign bodies/material noted and no underlying fracture noted     Wound extent comment:  No exposed cartilage Treatment:    Area cleansed with:  Betadine and soap and water   Amount of cleaning:  Extensive Skin repair:    Repair method:  Sutures   Suture size:  6-0   Suture material:  Prolene   Suture technique:  Simple interrupted   Number of sutures:  13 Post-procedure details:    Dressing:  Open (no dressing)   Patient tolerance of procedure:  Tolerated well, no immediate complications     Initial Impression / Assessment and Plan / MDM / ED Course  39 y.o. male who presents to the ED for multiple lacerations to the face 2/2 dog bite.   Lacerations as noted above. Updated Tdap and given dose of IV antibiotics. Dog is UTD on vaccines. CT imaging does not reveal any underlying fractures.   Given lacerations are located on the face, discussed suture repair with patient (in setting of dog bite an risk of infection) who is in agreement with laceration repair.   Lacerations repaired as above. Elected to use non-absorbable suture in setting of dog bite in case he develops infection, for easier removal if necessary. Wounds thoroughly cleaned with saline and betadine. Discussed risk of infection with dog bites with patient and wife at bedside with regards to detailed wound care, antibiotic ointment, and oral antibiotic course. Discussed very strict return precautions, they voice understanding of this.  Recheck here, urgent care, or PCP for re-evaluation of healing and suture removal.   _______________________________   As part of my medical decision making I have reviewed available labs, radiology tests, reviewed old records.  Final Clinical Impression(s) /  ED Diagnosis  Final diagnoses:  Dog bite of face, initial encounter       Note:  This document was prepared using Dragon voice recognition software and may include unintentional dictation errors.   Miguel Aschoff., MD 07/06/19 [phone]

## 2019-07-06 NOTE — Discharge Instructions (Addendum)
Your face lacerations were repaired with stitches that need to be removed in 5-7 days. Return to PCP clinic, [facility], urgent care, or an ER for removal.   Please keep it clean and dry for the next 24 hours. After this you may let soap and water wash over it but do not scrub too vigorously. Apply antibiotic ointment twice a day. Take your oral antibiotics as prescribed.   Return sooner if you develop any evidence of infection, such as redness, welling, drainage, fevers, pus, etc.

## 2019-07-12 ENCOUNTER — Other Ambulatory Visit: Payer: Self-pay

## 2019-07-12 ENCOUNTER — Encounter: Payer: Self-pay | Admitting: Emergency Medicine

## 2019-07-12 ENCOUNTER — Emergency Department
Admission: EM | Admit: 2019-07-12 | Discharge: 2019-07-12 | Disposition: A | Discharge status: Home / Self Care | Payer: Self-pay | Attending: Emergency Medicine | Admitting: Emergency Medicine

## 2019-07-12 DIAGNOSIS — W540XXD Bitten by dog, subsequent encounter: Secondary | ICD-10-CM | POA: Insufficient documentation

## 2019-07-12 DIAGNOSIS — Z4802 Encounter for removal of sutures: Secondary | ICD-10-CM | POA: Insufficient documentation

## 2019-07-12 DIAGNOSIS — Z87891 Personal history of nicotine dependence: Secondary | ICD-10-CM | POA: Insufficient documentation

## 2019-07-12 DIAGNOSIS — Z79899 Other long term (current) drug therapy: Secondary | ICD-10-CM | POA: Insufficient documentation

## 2019-07-12 DIAGNOSIS — S0185XD Open bite of other part of head, subsequent encounter: Secondary | ICD-10-CM | POA: Insufficient documentation

## 2019-07-12 NOTE — Discharge Instructions (Signed)
Follow-up with your primary care provider if any continued problems or urgent care if needed.  Began cleaning these areas daily with mild soap and water.  Allow the scabs to fall off on their own.  Continue watching for any signs of infection.

## 2019-07-12 NOTE — ED Notes (Signed)
This RN removed remaining sutures below Pt's nose. Area also assessed by PA to confirm suture removal.

## 2019-07-12 NOTE — ED Notes (Signed)
Pt verbalized understanding of discharge instructions. NAD at this time. 

## 2019-07-12 NOTE — ED Triage Notes (Signed)
Pt to ED via POV for suture removal. Pt denies any complaints at this time.

## 2019-07-12 NOTE — ED Notes (Signed)
Pt here for removal of sutures below nose. No signs or symptoms of infection noted.

## 2019-07-12 NOTE — ED Provider Notes (Signed)
[facility] Emergency Department Provider Note   ____________________________________________   First MD Initiated Contact with Patient 07/12/19 1539     (approximate)  I have reviewed the triage vital signs and the nursing notes.   HISTORY  Chief Complaint Suture / Staple Removal   HPI Vernon Hudson is a 39 y.o. male resents to the ED for suture removal.  Patient was seen in the ED on 07/05/19 for dog bite to his face.  He denies any concerns for infection.     Past Medical History:  Diagnosis Date  . Anxiety   . Heart murmur   . Substance abuse [facility])     Patient Active Problem List   Diagnosis Date Noted  . Chronic pain syndrome 11/06/2011  . 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 Depression 11/06/2011  . GAD (generalized anxiety disorder) 11/06/2011  . Nicotine addiction 11/06/2011    Past Surgical History:  Procedure Laterality Date  . FRACTURE SURGERY      Prior to Admission medications   Medication Sig Start Date End Date Taking? Authorizing Provider  amoxicillin-clavulanate (AUGMENTIN) 875-125 MG tablet Take 1 tablet by mouth 2 (two) times daily for 10 days. 07/06/19 07/16/19  Miguel Aschoff., MD  clonazePAM (KLONOPIN) 0.5 MG tablet Take 1 tablet (0.5 mg total) by mouth 2 (two) times daily as needed for anxiety. 02/09/16   Wallis Bamberg, PA-C  clonazePAM (KLONOPIN) 1 MG tablet Take 1 tablet (1 mg total) by mouth 2 (two) times daily. 02/09/16   Wallis Bamberg, PA-C  clonazePAM (KLONOPIN) 1 MG tablet Take 1 tablet (1 mg total) by mouth daily. 02/09/16   Wallis Bamberg, PA-C  hydrOXYzine (VISTARIL) 50 MG capsule Take 1-2 capsules (50-100 mg total) by mouth daily as needed for anxiety (Sleep). 02/09/16   Wallis Bamberg, PA-C  mupirocin ointment (BACTROBAN) 2 % Apply to affected area 2 times daily after cleaning 07/06/19 07/05/20  Miguel Aschoff., MD  pantoprazole (PROTONIX) 40 MG tablet Take 1 tablet (40 mg total) by mouth daily. 03/29/18  03/29/19  Minna Antis, MD  sertraline (ZOLOFT) 100 MG tablet Take 1 tablet (100 mg total) daily by mouth. Office visit needed for refills 02/17/17   Wallis Bamberg, PA-C    Allergies Patient has no known allergies.  Family History  Problem Relation Age of Onset  . Heart disease Mother   . Heart disease Father     Social History Social History   Tobacco Use  . Smoking status: Former Smoker    Packs/day: 0.40    Years: 12.00    Pack years: 4.80    Types: Cigarettes  . Smokeless tobacco: Former Neurosurgeon    Types: Snuff  Substance Use Topics  . Alcohol use: No  . Drug use: No    Review of Systems Constitutional: No fever/chills Eyes: No visual changes. ENT: No complaints. Cardiovascular: Denies chest pain. Respiratory: Denies shortness of breath. Musculoskeletal: Negative for flex. Skin: Healing wounds to face. Neurological: Negative for headaches, focal weakness or numbness. ___________________________________________   PHYSICAL EXAM:  VITAL SIGNS: ED Triage Vitals  Enc Vitals Group     BP 07/12/19 1526 139/82     Pulse Rate 07/12/19 1526 76     Resp 07/12/19 1526 18     Temp 07/12/19 1526 98 F (36.7 C)     Temp Source 07/12/19 1526 Oral     SpO2 07/12/19 1526 98 %     Weight 07/12/19 1530 151 lb 0.2 oz (68.5  kg)     Height 07/12/19 1530 5\' 7"  (1.702 m)     Head Circumference --      Peak Flow --      Pain Score 07/12/19 1530 0     Pain Loc --      Pain Edu? --      Excl. in [place]? --    Constitutional: Alert and oriented. Well appearing and in no acute distress. Eyes: Conjunctivae are normal.  Head: Atraumatic. Neck: No stridor.   Cardiovascular:  Good peripheral circulation. Respiratory: Normal respiratory effort.  No retractions.  Musculoskeletal: His upper and lower extremities that any difficulty. Neurologic:  Normal speech and language. No gross focal neurologic deficits are appreciated. No gait instability. Skin:  Skin is warm, dry and intact.   Multiple sutured areas with no sign of infection. Psychiatric: Mood and affect are normal. Speech and behavior are normal.  ____________________________________________   LABS (all labs ordered are listed, but only abnormal results are displayed)  Labs Reviewed - No data to display   PROCEDURES  Procedure(s) performed (including Critical Care):  Procedures Suture removal by RN  ____________________________________________   INITIAL IMPRESSION / ASSESSMENT AND PLAN / ED COURSE  As part of my medical decision making, I reviewed the following data within the electronic MEDICAL RECORD NUMBER Notes from prior ED visits and [place] Controlled Substance Database  39 year old male presents to the ED for suture removal.  Patient was seen in the ED on 07/05/2019 after his dog bit him in the face.  He denies any difficulty or concerns for infection.  Sutures were removed without any difficulty.   ____________________________________________   FINAL CLINICAL IMPRESSION(S) / ED DIAGNOSES  Final diagnoses:  Visit for suture removal     ED Discharge Orders    None       Note:  This document was prepared using Dragon voice recognition software and may include unintentional dictation errors.    Johnn Hai, PA-C 07/12/19 1601    Vanessa [place], MD 07/13/19 [phone]

## 2019-12-14 ENCOUNTER — Other Ambulatory Visit: Payer: Self-pay

## 2019-12-14 ENCOUNTER — Emergency Department
Admission: EM | Admit: 2019-12-14 | Discharge: 2019-12-14 | Disposition: A | Discharge status: Home / Self Care | Payer: Self-pay | Attending: Emergency Medicine | Admitting: Emergency Medicine

## 2019-12-14 ENCOUNTER — Encounter: Payer: Self-pay | Admitting: Emergency Medicine

## 2019-12-14 ENCOUNTER — Emergency Department: Payer: Self-pay

## 2019-12-14 DIAGNOSIS — Z79899 Other long term (current) drug therapy: Secondary | ICD-10-CM | POA: Insufficient documentation

## 2019-12-14 DIAGNOSIS — X58XXXA Exposure to other specified factors, initial encounter: Secondary | ICD-10-CM | POA: Insufficient documentation

## 2019-12-14 DIAGNOSIS — Y93H2 Activity, gardening and landscaping: Secondary | ICD-10-CM | POA: Insufficient documentation

## 2019-12-14 DIAGNOSIS — S62325A Displaced fracture of shaft of fourth metacarpal bone, left hand, initial encounter for closed fracture: Secondary | ICD-10-CM

## 2019-12-14 DIAGNOSIS — Y998 Other external cause status: Secondary | ICD-10-CM | POA: Insufficient documentation

## 2019-12-14 DIAGNOSIS — Y92017 Garden or yard in single-family (private) house as the place of occurrence of the external cause: Secondary | ICD-10-CM | POA: Insufficient documentation

## 2019-12-14 DIAGNOSIS — Z87891 Personal history of nicotine dependence: Secondary | ICD-10-CM | POA: Insufficient documentation

## 2019-12-14 MED ORDER — KETOROLAC TROMETHAMINE 30 MG/ML IJ SOLN
30.0000 mg | Freq: Once | INTRAMUSCULAR | Status: AC
  Administered 2019-12-14: 30 mg via INTRAMUSCULAR
  Filled 2019-12-14: qty 1

## 2019-12-14 MED ORDER — KETOROLAC TROMETHAMINE 10 MG PO TABS: 10.0000 mg | ORAL_TABLET | Freq: Four times a day (QID) | ORAL | 0 refills | Status: DC | PRN

## 2019-12-14 NOTE — ED Notes (Signed)
See triage note  Presents with pain to left hand  States he was working with a large limb  Unsure if he hit his hand  Having pain to hand  With deformity noted to fingers

## 2019-12-14 NOTE — ED Triage Notes (Signed)
Injured left hand while doing yard work.

## 2019-12-14 NOTE — ED Provider Notes (Signed)
[facility] Emergency Department Provider Note  ____________________________________________  Time seen: Approximately 2:55 PM  I have reviewed the triage vital signs and the nursing notes.   HISTORY  Chief Complaint Hand Injury    HPI Vernon Hudson is a 39 y.o. male that presents to the emergency department for evaluation of left hand pain after injury today.  He has having pain to his left lateral hand and left knuckle.  Patient was trying to break a tree branch when he injured his left hand.  Patient has a Dupuytren's contracture to his left fourth finger.  No additional injuries.  Past Medical History:  Diagnosis Date  . Anxiety   . Heart murmur   . Substance abuse [facility])     Patient Active Problem List   Diagnosis Date Noted  . Chronic pain syndrome 11/06/2011  . 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 Depression 11/06/2011  . GAD (generalized anxiety disorder) 11/06/2011  . Nicotine addiction 11/06/2011    Past Surgical History:  Procedure Laterality Date  . FRACTURE SURGERY      Prior to Admission medications   Medication Sig Start Date End Date Taking? Authorizing Provider  clonazePAM (KLONOPIN) 0.5 MG tablet Take 1 tablet (0.5 mg total) by mouth 2 (two) times daily as needed for anxiety. 02/09/16   Wallis Bamberg, PA-C  clonazePAM (KLONOPIN) 1 MG tablet Take 1 tablet (1 mg total) by mouth 2 (two) times daily. 02/09/16   Wallis Bamberg, PA-C  clonazePAM (KLONOPIN) 1 MG tablet Take 1 tablet (1 mg total) by mouth daily. 02/09/16   Wallis Bamberg, PA-C  hydrOXYzine (VISTARIL) 50 MG capsule Take 1-2 capsules (50-100 mg total) by mouth daily as needed for anxiety (Sleep). 02/09/16   Wallis Bamberg, PA-C  ketorolac (TORADOL) 10 MG tablet Take 1 tablet (10 mg total) by mouth every 6 (six) hours as needed. 12/14/19   Enid Derry, PA-C  mupirocin ointment (BACTROBAN) 2 % Apply to affected area 2 times daily after cleaning 07/06/19 07/05/20  Miguel Aschoff., MD  pantoprazole (PROTONIX) 40 MG tablet Take 1 tablet (40 mg total) by mouth daily. 03/29/18 03/29/19  Minna Antis, MD  sertraline (ZOLOFT) 100 MG tablet Take 1 tablet (100 mg total) daily by mouth. Office visit needed for refills 02/17/17   Wallis Bamberg, PA-C    Allergies Patient has no known allergies.  Family History  Problem Relation Age of Onset  . Heart disease Mother   . Heart disease Father     Social History Social History   Tobacco Use  . Smoking status: Former Smoker    Packs/day: 0.40    Years: 12.00    Pack years: 4.80    Types: Cigarettes  . Smokeless tobacco: Former Neurosurgeon    Types: Snuff  Substance Use Topics  . Alcohol use: No  . Drug use: No     Review of Systems  Constitutional: No fever/chills Respiratory: No SOB. Gastrointestinal: No abdominal pain.  No nausea, no vomiting.  Musculoskeletal: Positive for hand pain. Skin: Negative for rash, abrasions, lacerations, ecchymosis. Neurological: Negative for headaches, numbness or tingling   ____________________________________________   PHYSICAL EXAM:  VITAL SIGNS: ED Triage Vitals [12/14/19 1306]  Enc Vitals Group     BP (!) 152/89     Pulse Rate 83     Resp 16     Temp 97.6 F (36.4 C)     Temp Source Oral     SpO2 98 %  Weight 151 lb 0.2 oz (68.5 kg)     Height 5\' 7"  (1.702 m)     Head Circumference      Peak Flow      Pain Score 8     Pain Loc      Pain Edu?      Excl. in GC?      Constitutional: Alert and oriented. Well appearing and in no acute distress. Eyes: Conjunctivae are normal. PERRL. EOMI. Head: Atraumatic. ENT:      Ears:      Nose: No congestion/rhinnorhea.      Mouth/Throat: Mucous membranes are moist.  Neck: No stridor.   Cardiovascular: Normal rate, regular rhythm.  Good peripheral circulation.  Symmetric radial pulses bilaterally. Respiratory: Normal respiratory effort without tachypnea or retractions. Lungs CTAB. Good air entry to the  bases with no decreased or absent breath sounds. Musculoskeletal: Full range of motion to all extremities. No gross deformities appreciated.  Tenderness to palpation to mid fourth metacarpal.  Minimal swelling. Neurologic:  Normal speech and language. No gross focal neurologic deficits are appreciated.  Skin:  Skin is warm, dry and intact. No rash noted. Psychiatric: Mood and affect are normal. Speech and behavior are normal. Patient exhibits appropriate insight and judgement.   ____________________________________________   LABS (all labs ordered are listed, but only abnormal results are displayed)  Labs Reviewed - No data to display ____________________________________________  EKG   ____________________________________________  RADIOLOGY , personally viewed and evaluated these images (plain radiographs) as part of my medical decision making, as well as reviewing the written report by the radiologist.  DG Hand Complete Left  Result Date: 12/14/2019 CLINICAL DATA:  Left hand pain after injury. EXAM: LEFT HAND - COMPLETE 3+ VIEW COMPARISON:  None. FINDINGS: Minimally displaced oblique fracture is seen involving the proximal fourth metacarpal. No other bony abnormality is noted. Joint spaces are intact. No soft tissue abnormality is noted. IMPRESSION: Minimally displaced proximal fourth metacarpal fracture. Electronically Signed   By: 02/13/2020 M.D.   On: 12/14/2019 13:43    ____________________________________________    PROCEDURES  Procedure(s) performed:    Procedures    Medications  ketorolac (TORADOL) 30 MG/ML injection 30 mg (30 mg Intramuscular Given 12/14/19 1512)     ____________________________________________   INITIAL IMPRESSION / ASSESSMENT AND PLAN / ED COURSE  Pertinent labs & imaging results that were available during my care of the patient were reviewed by me and considered in my medical decision making (see chart for  details).  Review of the [place] CSRS was performed in accordance of the [facility] prior to dispensing any controlled drugs.   Patient's diagnosis is consistent with fourth metacarpal fracture.  Vital signs and exam are reassuring.  X-ray consistent with fracture.  Ulnar gutter splint was placed.  Patient was given IM Toradol for pain.  Patient will be discharged home with prescriptions for Toradol. Patient is to follow up with orthopedics as directed. Patient is given ED precautions to return to the ED for any worsening or new symptoms.   Vernon Hudson was evaluated in Emergency Department on 12/14/2019 for the symptoms described in the history of present illness. He was evaluated in the context of the global COVID-19 pandemic, which necessitated consideration that the patient might be at risk for infection with the SARS-CoV-2 virus that causes COVID-19. Institutional protocols and algorithms that pertain to the evaluation of patients at risk for COVID-19 are in a state of rapid change based on  information released by regulatory bodies including the CDC and federal and state organizations. These policies and algorithms were followed during the patient's care in the ED.  ____________________________________________  FINAL CLINICAL IMPRESSION(S) / ED DIAGNOSES  Final diagnoses:  Closed displaced fracture of shaft of fourth metacarpal bone of left hand, initial encounter      NEW MEDICATIONS STARTED DURING THIS VISIT:  ED Discharge Orders         Ordered    ketorolac (TORADOL) 10 MG tablet  Every 6 hours PRN        12/14/19 1515              This chart was dictated using voice recognition software/Dragon. Despite best efforts to proofread, errors can occur which can change the meaning. Any change was purely unintentional.    Enid Derry, PA-C 12/14/19 1547    Dionne Bucy, MD 12/15/19 [phone]

## 2020-03-30 ENCOUNTER — Emergency Department: Admission: EM | Admit: 2020-03-30 | Discharge: 2020-03-30 | Discharge status: Against Medical Advice | Payer: Self-pay

## 2020-07-21 ENCOUNTER — Other Ambulatory Visit: Payer: Self-pay

## 2020-07-21 ENCOUNTER — Emergency Department: Payer: Self-pay

## 2020-07-21 ENCOUNTER — Emergency Department
Admission: EM | Admit: 2020-07-21 | Discharge: 2020-07-21 | Disposition: A | Discharge status: Home / Self Care | Payer: Self-pay | Attending: Physician Assistant | Admitting: Physician Assistant

## 2020-07-21 DIAGNOSIS — Z87891 Personal history of nicotine dependence: Secondary | ICD-10-CM | POA: Insufficient documentation

## 2020-07-21 DIAGNOSIS — Z79899 Other long term (current) drug therapy: Secondary | ICD-10-CM | POA: Insufficient documentation

## 2020-07-21 DIAGNOSIS — S300XXA Contusion of lower back and pelvis, initial encounter: Secondary | ICD-10-CM | POA: Insufficient documentation

## 2020-07-21 DIAGNOSIS — W19XXXA Unspecified fall, initial encounter: Secondary | ICD-10-CM | POA: Insufficient documentation

## 2020-07-21 DIAGNOSIS — M25552 Pain in left hip: Secondary | ICD-10-CM | POA: Insufficient documentation

## 2020-07-21 DIAGNOSIS — S20229A Contusion of unspecified back wall of thorax, initial encounter: Secondary | ICD-10-CM

## 2020-07-21 LAB — URINALYSIS, COMPLETE (UACMP) WITH MICROSCOPIC
Bacteria, UA: NONE SEEN
Bilirubin Urine: NEGATIVE
Glucose, UA: NEGATIVE mg/dL
Hgb urine dipstick: NEGATIVE
Ketones, ur: NEGATIVE mg/dL
Leukocytes,Ua: NEGATIVE
Nitrite: NEGATIVE
Protein, ur: NEGATIVE mg/dL
Specific Gravity, Urine: 1.025 (ref 1.005–1.030)
Squamous Epithelial / HPF: NONE SEEN (ref 0–5)
pH: 5 (ref 5.0–8.0)

## 2020-07-21 MED ORDER — LIDOCAINE 5 % EX PTCH
1.0000 | MEDICATED_PATCH | CUTANEOUS | Status: DC
  Administered 2020-07-21: 1 via TRANSDERMAL
  Filled 2020-07-21: qty 1

## 2020-07-21 MED ORDER — LIDOCAINE 5 % EX PTCH: 1.0000 | MEDICATED_PATCH | Freq: Two times a day (BID) | CUTANEOUS | 0 refills | Status: AC

## 2020-07-21 MED ORDER — ORPHENADRINE CITRATE 30 MG/ML IJ SOLN
60.0000 mg | Freq: Two times a day (BID) | INTRAMUSCULAR | Status: DC
  Filled 2020-07-21: qty 2

## 2020-07-21 MED ORDER — ORPHENADRINE CITRATE ER 100 MG PO TB12: 100.0000 mg | ORAL_TABLET | Freq: Two times a day (BID) | ORAL | 0 refills | Status: AC

## 2020-07-21 MED ORDER — KETOROLAC TROMETHAMINE 60 MG/2ML IM SOLN
60.0000 mg | Freq: Once | INTRAMUSCULAR | Status: AC
  Administered 2020-07-21: 60 mg via INTRAMUSCULAR
  Filled 2020-07-21: qty 2

## 2020-07-21 MED ORDER — KETOROLAC TROMETHAMINE 10 MG PO TABS: 10.0000 mg | ORAL_TABLET | Freq: Four times a day (QID) | ORAL | 0 refills | Status: AC | PRN

## 2020-07-21 NOTE — ED Provider Notes (Signed)
[facility] Emergency Department Provider Note   ____________________________________________   Event Date/Time   First MD Initiated Contact with Patient 07/21/20 [phone]     (approximate)  I have reviewed the triage vital signs and the nursing notes.   HISTORY  Chief Complaint Back Pain    HPI Vernon Hudson is a 40 y.o. male patient presents with back and left hip pain secondary to fall which occurred yesterday.  Patient denies bladder or bowel dysfunction.  Patient denies radicular component to his pain.  Patient rates pain as 8/10.  Described pain as "achy".  No palliative measures for complaint.  Patient insists no narcotic medications for pain.         Past Medical History:  Diagnosis Date  . Anxiety   . Heart murmur   . Substance abuse [facility])     Patient Active Problem List   Diagnosis Date Noted  . Chronic pain syndrome 11/06/2011  . 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 Depression 11/06/2011  . GAD (generalized anxiety disorder) 11/06/2011  . Nicotine addiction 11/06/2011    Past Surgical History:  Procedure Laterality Date  . FRACTURE SURGERY      Prior to Admission medications   Medication Sig Start Date End Date Taking? Authorizing Provider  ketorolac (TORADOL) 10 MG tablet Take 1 tablet (10 mg total) by mouth every 6 (six) hours as needed. 07/21/20  Yes Joni Reining, PA-C  lidocaine (LIDODERM) 5 % Place 1 patch onto the skin every 12 (twelve) hours. Remove & Discard patch within 12 hours or as directed by MD 07/21/20 07/21/21 Yes Joni Reining, PA-C  orphenadrine (NORFLEX) 100 MG tablet Take 1 tablet (100 mg total) by mouth 2 (two) times daily. 07/21/20  Yes Joni Reining, PA-C  clonazePAM (KLONOPIN) 0.5 MG tablet Take 1 tablet (0.5 mg total) by mouth 2 (two) times daily as needed for anxiety. 02/09/16   Wallis Bamberg, PA-C  clonazePAM (KLONOPIN) 1 MG tablet Take 1 tablet (1 mg total) by mouth 2 (two) times  daily. 02/09/16   Wallis Bamberg, PA-C  clonazePAM (KLONOPIN) 1 MG tablet Take 1 tablet (1 mg total) by mouth daily. 02/09/16   Wallis Bamberg, PA-C  hydrOXYzine (VISTARIL) 50 MG capsule Take 1-2 capsules (50-100 mg total) by mouth daily as needed for anxiety (Sleep). 02/09/16   Wallis Bamberg, PA-C  sertraline (ZOLOFT) 100 MG tablet Take 1 tablet (100 mg total) daily by mouth. Office visit needed for refills 02/17/17   Wallis Bamberg, PA-C    Allergies Patient has no known allergies.  Family History  Problem Relation Age of Onset  . Heart disease Mother   . Heart disease Father     Social History Social History   Tobacco Use  . Smoking status: Former Smoker    Packs/day: 0.40    Years: 12.00    Pack years: 4.80    Types: Cigarettes  . Smokeless tobacco: Former Neurosurgeon    Types: Snuff  Substance Use Topics  . Alcohol use: No  . Drug use: No    Review of Systems  Constitutional: No fever/chills Eyes: No visual changes. ENT: No sore throat. Cardiovascular: Denies chest pain. Respiratory: Denies shortness of breath. Gastrointestinal: No abdominal pain.  No nausea, no vomiting.  No diarrhea.  No constipation. Genitourinary: Negative for dysuria. Musculoskeletal: Positive for back and left hip pain. Skin: Negative for rash. Neurological: Negative for headaches, focal weakness or numbness. Psychiatric:  History of drug abuse  ____________________________________________   PHYSICAL EXAM:  VITAL SIGNS: ED Triage Vitals  Enc Vitals Group     BP 07/21/20 0838 125/79     Pulse Rate 07/21/20 0838 81     Resp 07/21/20 0838 20     Temp 07/21/20 0838 98.7 F (37.1 C)     Temp Source 07/21/20 0838 Oral     SpO2 07/21/20 0838 97 %     Weight 07/21/20 0838 151 lb (68.5 kg)     Height 07/21/20 0838 5\' 7"  (1.702 m)     Head Circumference --      Peak Flow --      Pain Score 07/21/20 0836 8     Pain Loc --      Pain Edu? --      Excl. in GC? --     Constitutional: Alert and oriented.  Well appearing and in no acute distress. Neck: No cervical spine tenderness to palpation. Hematological/Lymphatic/Immunilogical: No cervical lymphadenopathy. Cardiovascular: Normal rate, regular rhythm. Grossly normal heart sounds.  Good peripheral circulation. Respiratory: Normal respiratory effort.  No retractions. Lungs CTAB. Gastrointestinal: Soft and nontender. No distention. No abdominal bruits. No CVA tenderness. Musculoskeletal: No lower extremity tenderness nor edema.  No joint effusions. Neurologic:  Normal speech and language. No gross focal neurologic deficits are appreciated. No gait instability. Skin:  Skin is warm, dry and intact. No rash noted.  Ecchymosis at the coccyx area. Psychiatric: Mood and affect are normal. Speech and behavior are normal.  ____________________________________________   LABS (all labs ordered are listed, but only abnormal results are displayed)  Labs Reviewed  URINALYSIS, COMPLETE (UACMP) WITH MICROSCOPIC - Abnormal; Notable for the following components:      Result Value   Color, Urine YELLOW (*)    APPearance CLEAR (*)    All other components within normal limits   ____________________________________________  EKG   ____________________________________________  RADIOLOGY I, 07/23/20, personally viewed and evaluated these images (plain radiographs) as part of my medical decision making, as well as reviewing the written report by the radiologist.  ED MD interpretation: No acute findings on x-ray of the lumbar spine and hip.  There are moderate degenerative changes.  Official radiology report(s): DG Lumbar Spine 2-3 Views  Result Date: 07/21/2020 CLINICAL DATA:  Low back and LEFT hip pain after falling yesterday, incident with a lawn mower EXAM: LUMBAR SPINE - 2-3 VIEW COMPARISON:  None FINDINGS: Six non-rib-bearing lumbar type segments. Vertebral body and disc space heights maintained. No fracture, subluxation, or bone destruction.  Osseous mineralization grossly normal for technique. SI joints preserved. IMPRESSION: No acute abnormalities. Electronically Signed   By: 07/23/2020 M.D.   On: 07/21/2020 10:17   DG Hip Unilat W or Wo Pelvis 2-3 Views Left  Result Date: 07/21/2020 CLINICAL DATA:  Low back and LEFT hip pain after falling yesterday, incident with a lawn mower EXAM: DG HIP (WITH OR WITHOUT PELVIS) 2-3V LEFT COMPARISON:  None FINDINGS: Mild narrowing of hip joints bilaterally. SI joints preserved. Osseous mineralization normal. No acute fracture, dislocation, or bone destruction. IMPRESSION: No acute osseous abnormalities. Mild narrowing of the hip joints bilaterally suggesting mild degenerative changes. Electronically Signed   By: 07/23/2020 M.D.   On: 07/21/2020 10:18    ____________________________________________   PROCEDURES  Procedure(s) performed (including Critical Care):  Procedures   ____________________________________________   INITIAL IMPRESSION / ASSESSMENT AND PLAN / ED COURSE  As part of my medical decision making, I reviewed the following data within  the electronic MEDICAL RECORD NUMBER         Patient complain of back and left hip pain secondary to fall which occurred yesterday.  Discussed no acute findings x-rays of the lumbar spine and hip.  Patient complaint physical exam consistent with contusion.  Patient given discharge care instruction advised take medication as directed.  Patient [facility].      ____________________________________________   FINAL CLINICAL IMPRESSION(S) / ED DIAGNOSES  Final diagnoses:  Contusion of back, unspecified laterality, initial encounter     ED Discharge Orders         Ordered    ketorolac (TORADOL) 10 MG tablet  Every 6 hours PRN        07/21/20 1030    orphenadrine (NORFLEX) 100 MG tablet  2 times daily        07/21/20 1030    lidocaine (LIDODERM) 5 %  Every 12 hours        07/21/20 1030           *Please note:  Vernon Hudson was evaluated in Emergency Department on 07/21/2020 for the symptoms described in the history of present illness. He was evaluated in the context of the global COVID-19 pandemic, which necessitated consideration that the patient might be at risk for infection with the SARS-CoV-2 virus that causes COVID-19. Institutional protocols and algorithms that pertain to the evaluation of patients at risk for COVID-19 are in a state of rapid change based on information released by regulatory bodies including the CDC and federal and state organizations. These policies and algorithms were followed during the patient's care in the ED.  Some ED evaluations and interventions may be delayed as a result of limited staffing during and the pandemic.*   Note:  This document was prepared using Dragon voice recognition software and may include unintentional dictation errors.    Joni Reining, PA-C 07/21/20 1034    Willy Eddy, MD 07/21/20 1343

## 2020-07-21 NOTE — ED Triage Notes (Signed)
Pt comes with c/o lower back pain since yesterday. Pt also states some hip pain.

## 2020-07-21 NOTE — ED Notes (Signed)
See triage note  Presents with lower back pain  States he had a lawn mower fall on him yesterday  Having pain to left lower back and hip area  Ambulates well

## 2020-07-21 NOTE — Discharge Instructions (Signed)
No acute findings on x-ray of the lumbar spine and hips.  There was notable degenerative changes.  Follow discharge care instruction take medication as directed.

## 2020-11-15 IMAGING — CR DG HAND COMPLETE 3+V*L*
3 series · 3 of 3 positions shown · non-contrast
Comparison: None.

CLINICAL DATA: Left hand pain after injury.

EXAM:
LEFT HAND - COMPLETE 3+ VIEW

[hand ap]
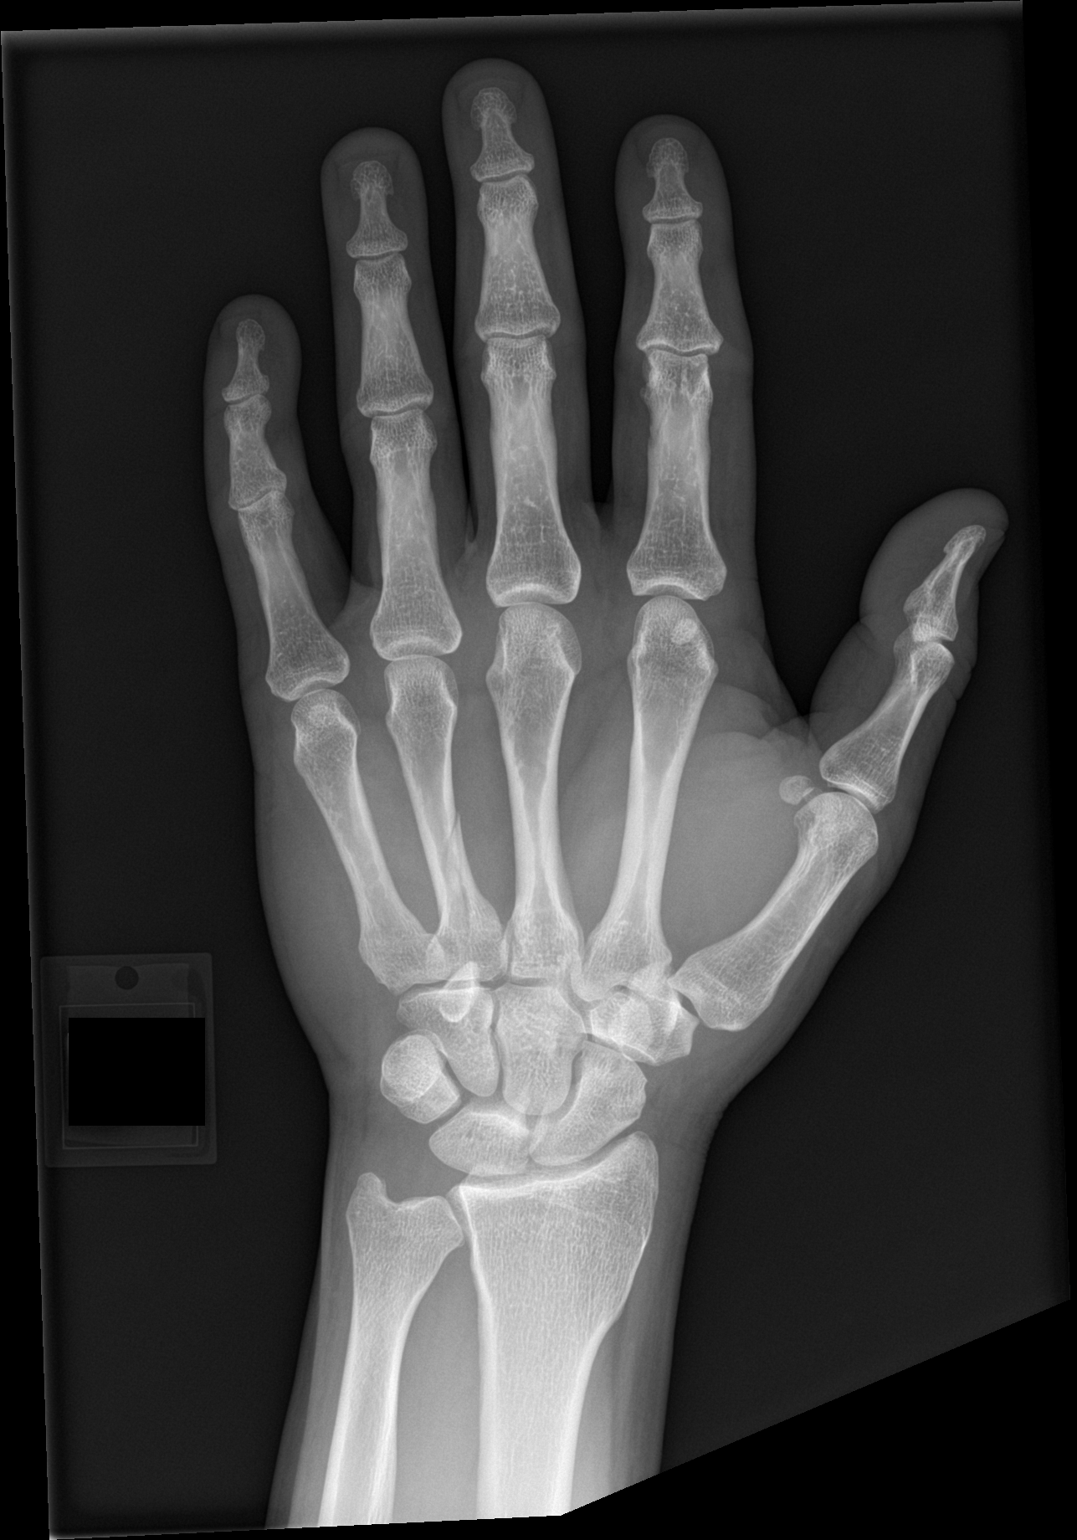

[hand obl]
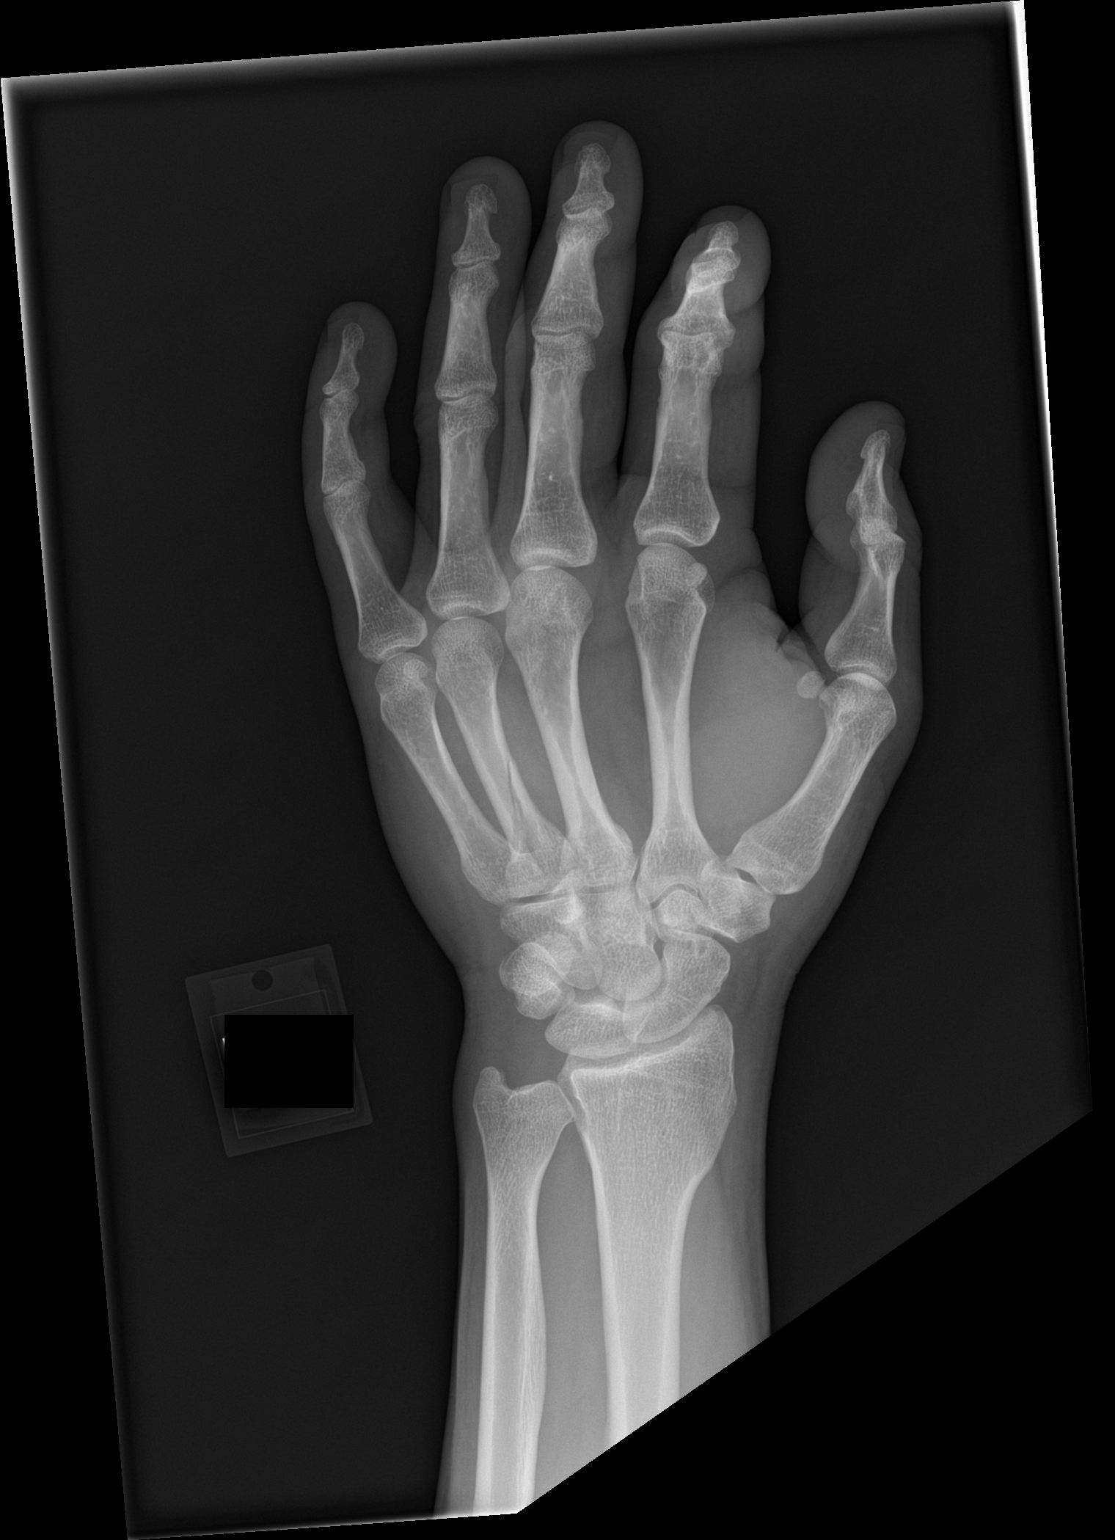

[hand lat]
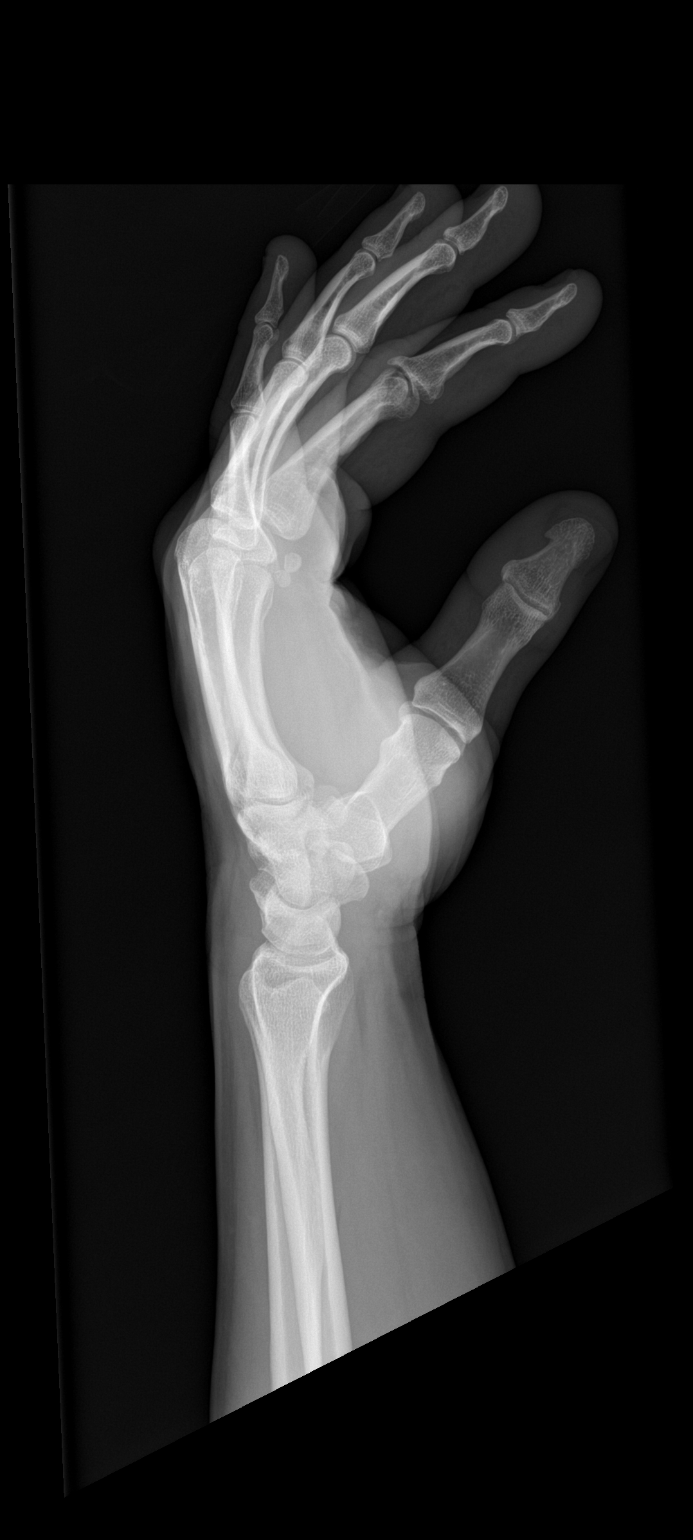

[3 of 3 positions shown; findings below may reference images not displayed]

FINDINGS: Minimally displaced oblique fracture is seen involving the proximal
fourth metacarpal. No other bony abnormality is noted. Joint spaces
are intact. No soft tissue abnormality is noted.
IMPRESSION: Minimally displaced proximal fourth metacarpal fracture.

## 2021-06-23 IMAGING — CR DG HIP (WITH OR WITHOUT PELVIS) 2-3V*L*
1 series · 3 of 3 positions shown · non-contrast
Comparison: None

CLINICAL DATA: Low back and LEFT hip pain after falling yesterday,
incident with a lawn mower

EXAM:
DG HIP (WITH OR WITHOUT PELVIS) 2-3V LEFT

[Series 1: dg hip unilat w or w/o pelvis 2-3 views  · non-contrast · 0.14mm/px · 3 of 3 slices shown]
[im 1/3]
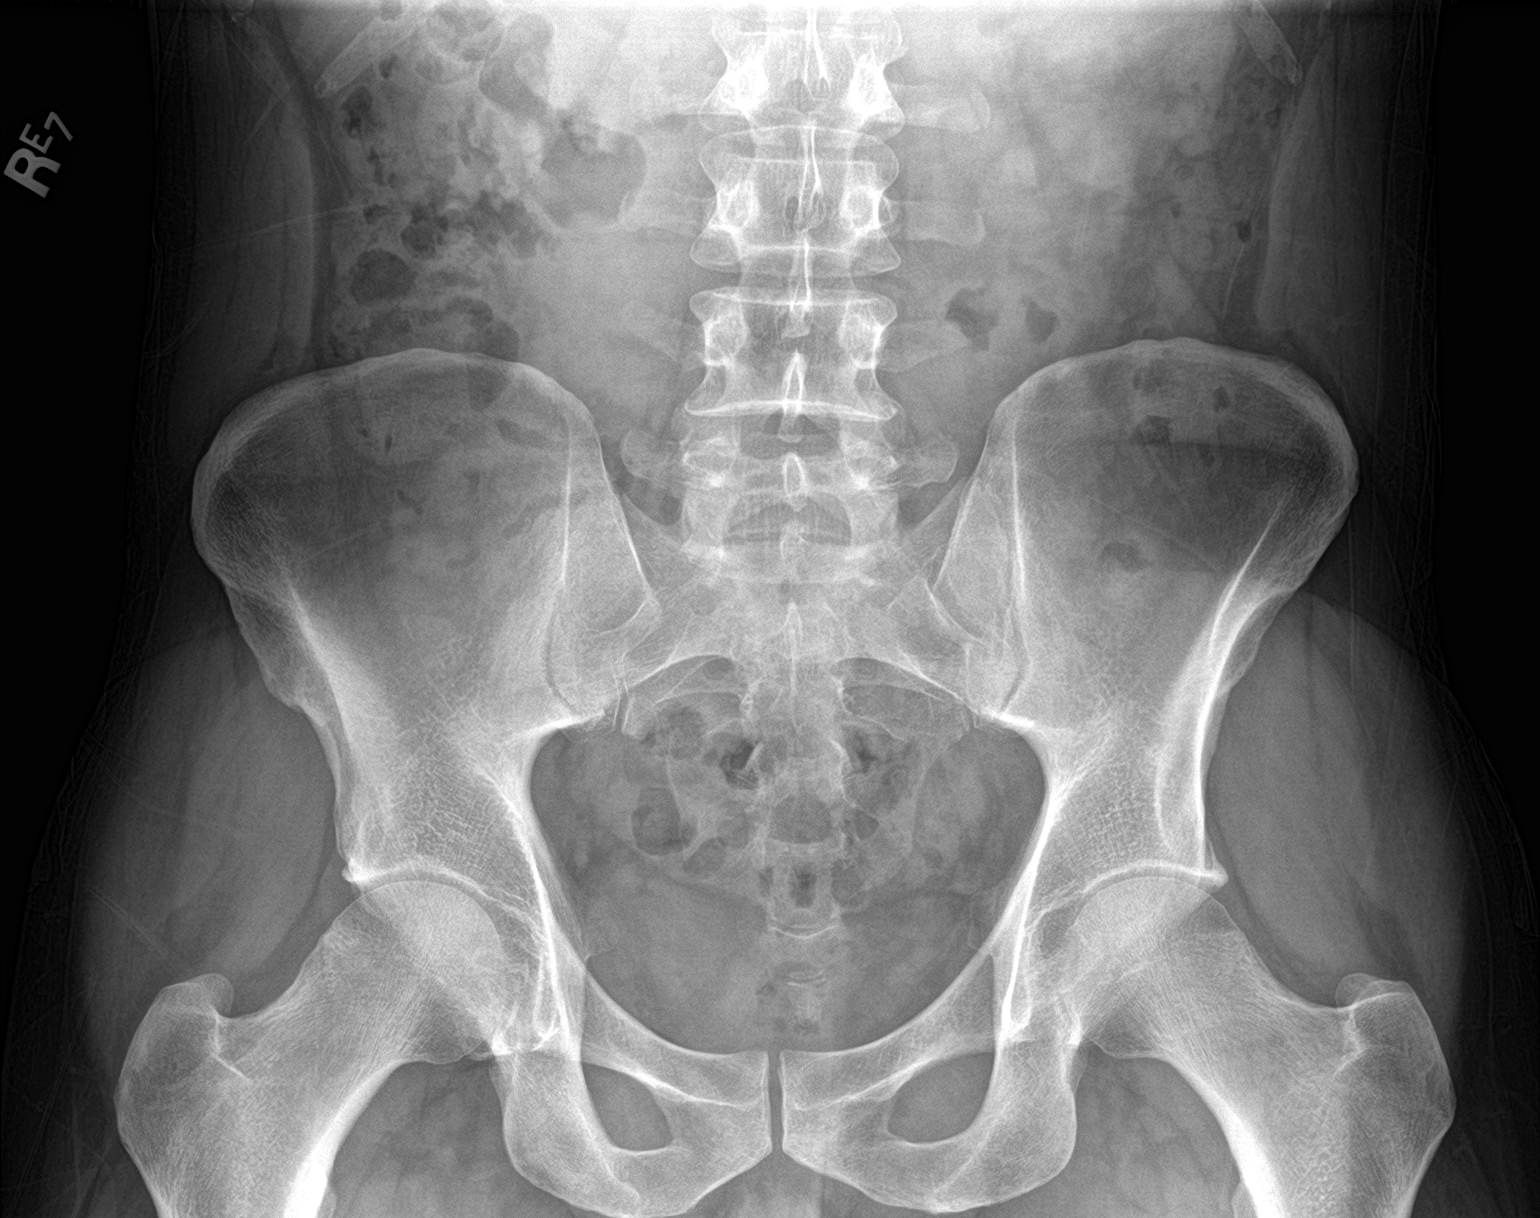
[im 2/3]
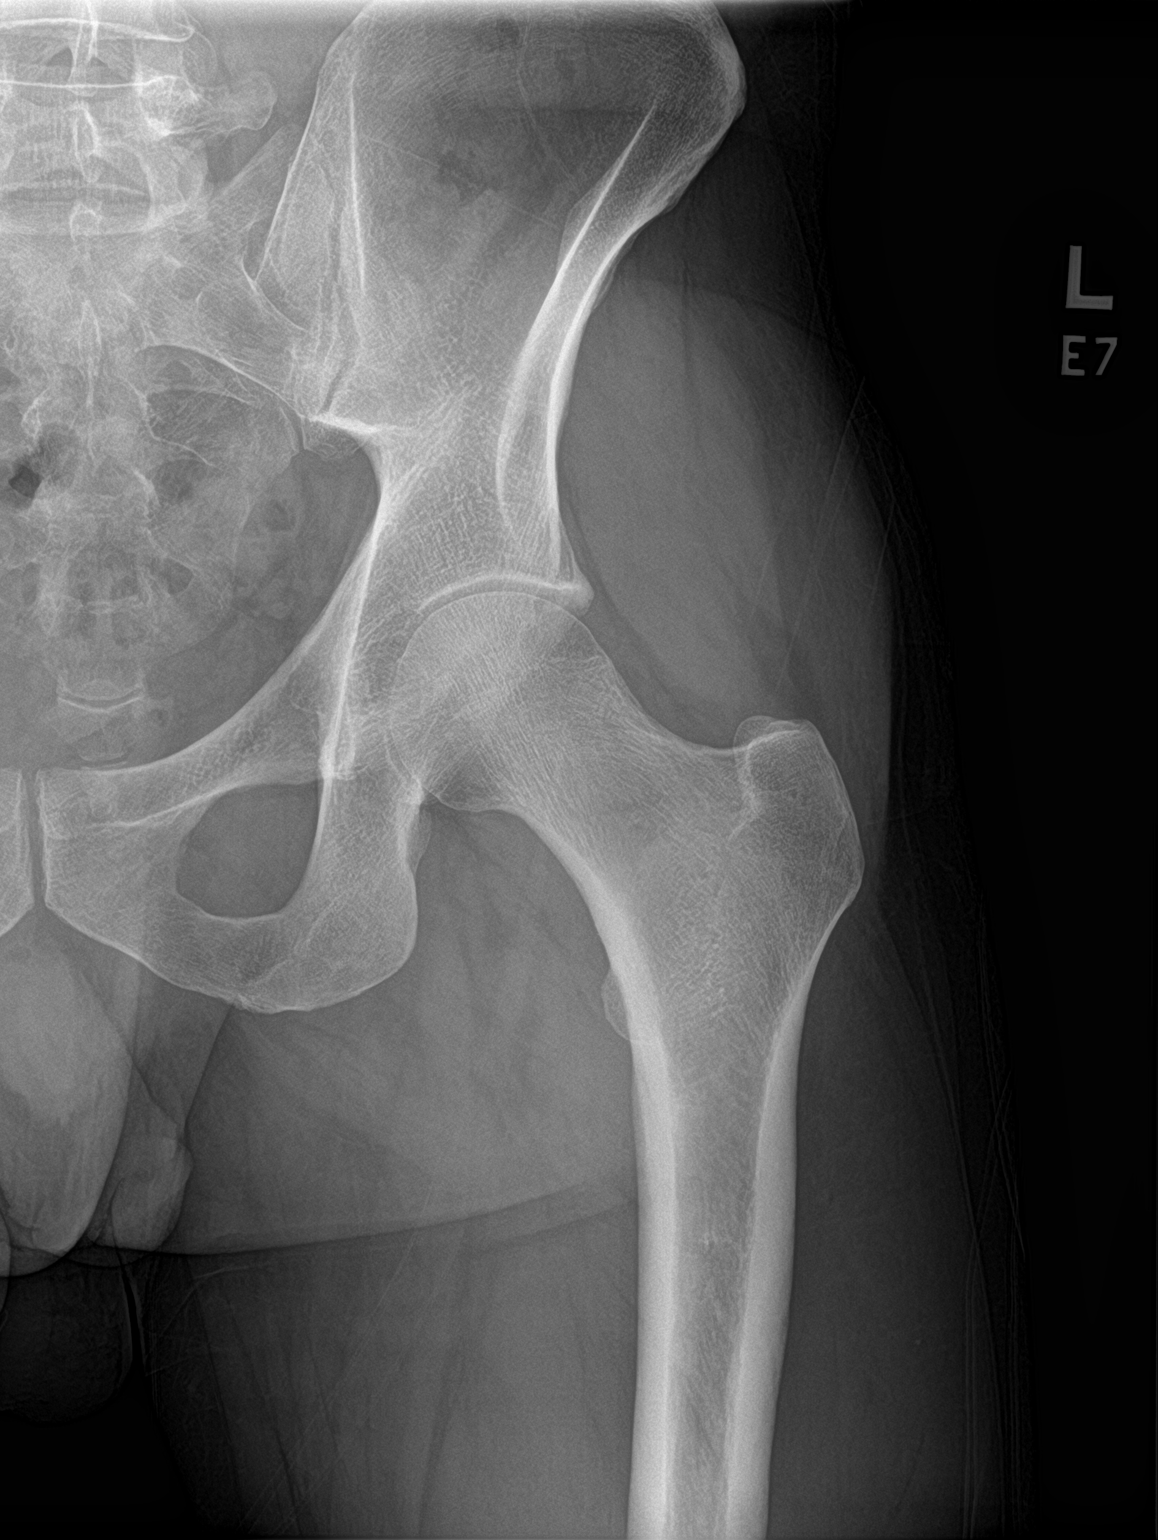
[im 3/3]
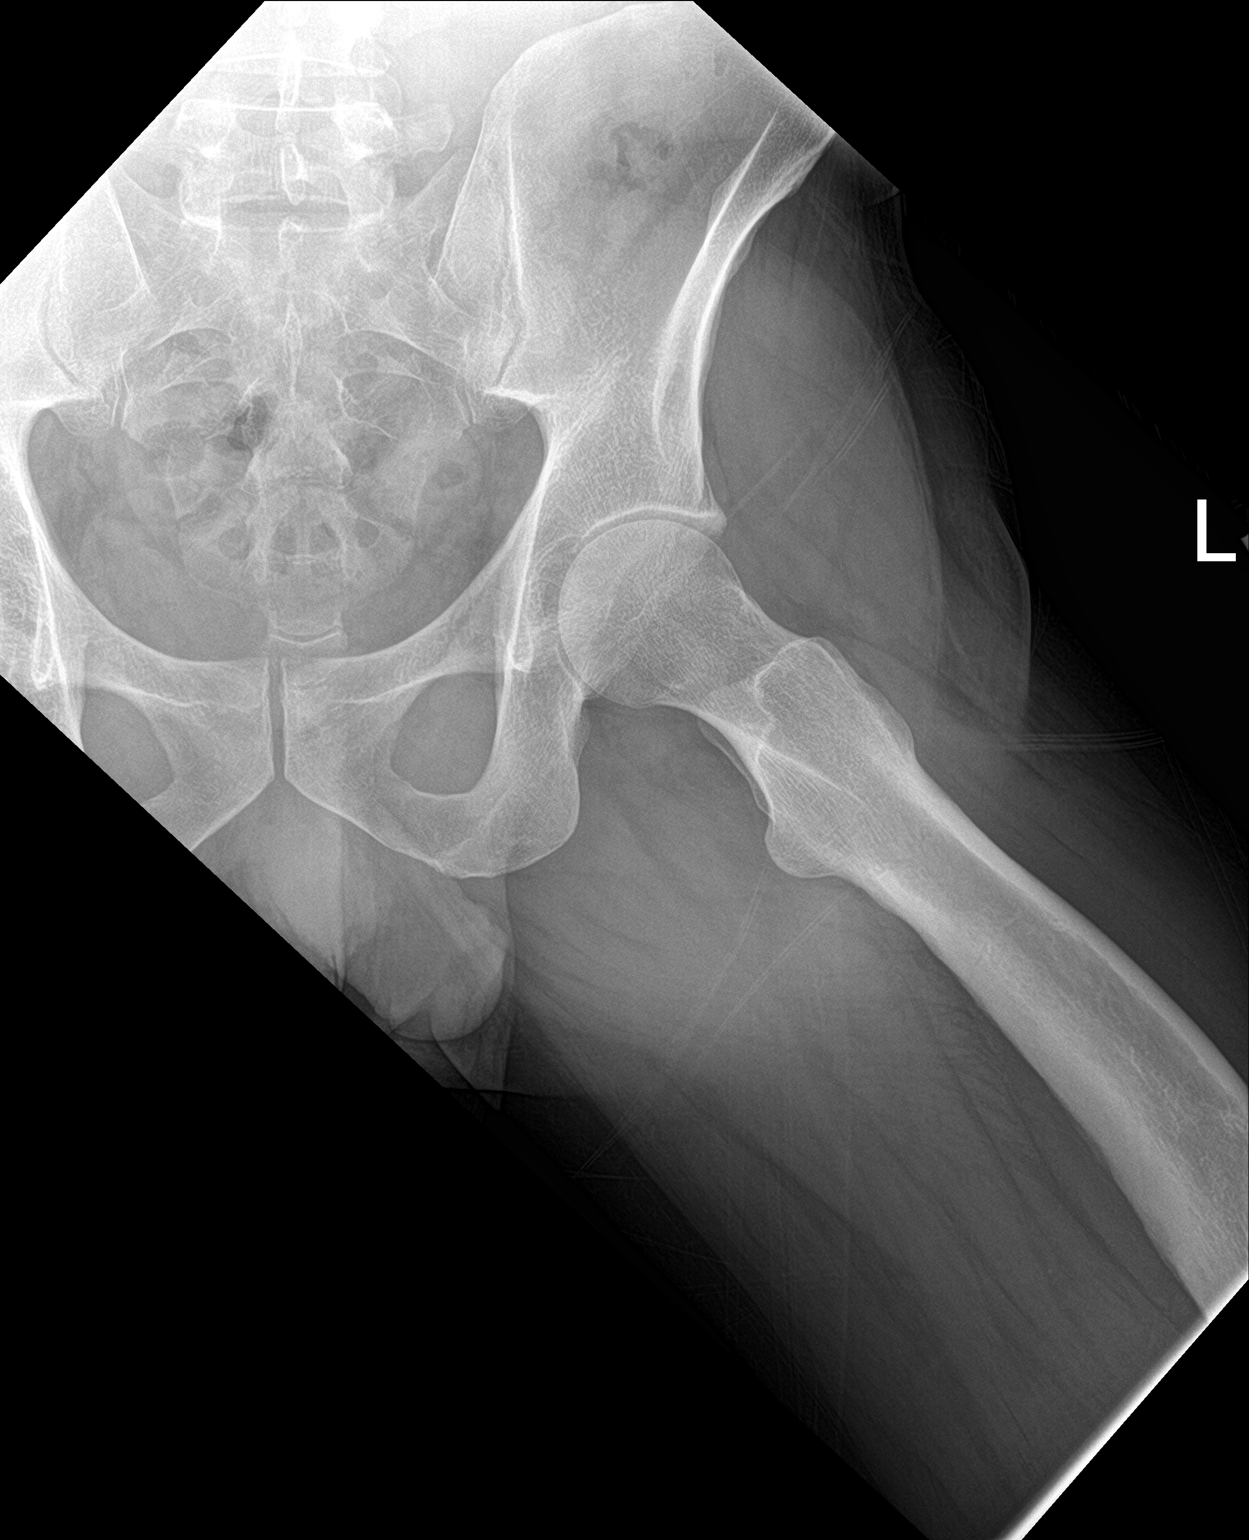

[3 of 3 positions shown; findings below may reference images not displayed]

FINDINGS: Mild narrowing of hip joints bilaterally.

SI joints preserved.

Osseous mineralization normal.

No acute fracture, dislocation, or bone destruction.
IMPRESSION: No acute osseous abnormalities.

Mild narrowing of the hip joints bilaterally suggesting mild
degenerative changes.

## 2022-02-04 DIAGNOSIS — R69 Illness, unspecified: Secondary | ICD-10-CM | POA: Diagnosis not present

## 2022-02-18 DIAGNOSIS — R69 Illness, unspecified: Secondary | ICD-10-CM | POA: Diagnosis not present

## 2022-03-03 DIAGNOSIS — Z8249 Family history of ischemic heart disease and other diseases of the circulatory system: Secondary | ICD-10-CM | POA: Diagnosis not present

## 2022-03-03 DIAGNOSIS — R69 Illness, unspecified: Secondary | ICD-10-CM | POA: Diagnosis not present

## 2022-03-03 DIAGNOSIS — Z808 Family history of malignant neoplasm of other organs or systems: Secondary | ICD-10-CM | POA: Diagnosis not present

## 2022-03-03 DIAGNOSIS — Z811 Family history of alcohol abuse and dependence: Secondary | ICD-10-CM | POA: Diagnosis not present

## 2022-03-03 DIAGNOSIS — Z87891 Personal history of nicotine dependence: Secondary | ICD-10-CM | POA: Diagnosis not present

## 2022-03-18 DIAGNOSIS — R69 Illness, unspecified: Secondary | ICD-10-CM | POA: Diagnosis not present

## 2022-04-05 DIAGNOSIS — M4722 Other spondylosis with radiculopathy, cervical region: Secondary | ICD-10-CM | POA: Diagnosis not present

## 2022-04-05 DIAGNOSIS — M9903 Segmental and somatic dysfunction of lumbar region: Secondary | ICD-10-CM | POA: Diagnosis not present

## 2022-04-05 DIAGNOSIS — M9901 Segmental and somatic dysfunction of cervical region: Secondary | ICD-10-CM | POA: Diagnosis not present

## 2022-04-05 DIAGNOSIS — M9904 Segmental and somatic dysfunction of sacral region: Secondary | ICD-10-CM | POA: Diagnosis not present

## 2022-04-10 DIAGNOSIS — M9904 Segmental and somatic dysfunction of sacral region: Secondary | ICD-10-CM | POA: Diagnosis not present

## 2022-04-10 DIAGNOSIS — M9903 Segmental and somatic dysfunction of lumbar region: Secondary | ICD-10-CM | POA: Diagnosis not present

## 2022-04-10 DIAGNOSIS — M9901 Segmental and somatic dysfunction of cervical region: Secondary | ICD-10-CM | POA: Diagnosis not present

## 2022-04-10 DIAGNOSIS — M4722 Other spondylosis with radiculopathy, cervical region: Secondary | ICD-10-CM | POA: Diagnosis not present

## 2022-04-12 DIAGNOSIS — M9904 Segmental and somatic dysfunction of sacral region: Secondary | ICD-10-CM | POA: Diagnosis not present

## 2022-04-12 DIAGNOSIS — M9903 Segmental and somatic dysfunction of lumbar region: Secondary | ICD-10-CM | POA: Diagnosis not present

## 2022-04-12 DIAGNOSIS — M4722 Other spondylosis with radiculopathy, cervical region: Secondary | ICD-10-CM | POA: Diagnosis not present

## 2022-04-12 DIAGNOSIS — M9901 Segmental and somatic dysfunction of cervical region: Secondary | ICD-10-CM | POA: Diagnosis not present

## 2022-04-15 DIAGNOSIS — R69 Illness, unspecified: Secondary | ICD-10-CM | POA: Diagnosis not present

## 2022-04-18 DIAGNOSIS — M9901 Segmental and somatic dysfunction of cervical region: Secondary | ICD-10-CM | POA: Diagnosis not present

## 2022-04-18 DIAGNOSIS — M4722 Other spondylosis with radiculopathy, cervical region: Secondary | ICD-10-CM | POA: Diagnosis not present

## 2022-04-18 DIAGNOSIS — M9904 Segmental and somatic dysfunction of sacral region: Secondary | ICD-10-CM | POA: Diagnosis not present

## 2022-04-18 DIAGNOSIS — M9903 Segmental and somatic dysfunction of lumbar region: Secondary | ICD-10-CM | POA: Diagnosis not present

## 2022-04-24 DIAGNOSIS — M4722 Other spondylosis with radiculopathy, cervical region: Secondary | ICD-10-CM | POA: Diagnosis not present

## 2022-04-24 DIAGNOSIS — M9904 Segmental and somatic dysfunction of sacral region: Secondary | ICD-10-CM | POA: Diagnosis not present

## 2022-04-24 DIAGNOSIS — M9901 Segmental and somatic dysfunction of cervical region: Secondary | ICD-10-CM | POA: Diagnosis not present

## 2022-04-24 DIAGNOSIS — M9903 Segmental and somatic dysfunction of lumbar region: Secondary | ICD-10-CM | POA: Diagnosis not present

## 2022-04-26 DIAGNOSIS — M9904 Segmental and somatic dysfunction of sacral region: Secondary | ICD-10-CM | POA: Diagnosis not present

## 2022-04-26 DIAGNOSIS — M4722 Other spondylosis with radiculopathy, cervical region: Secondary | ICD-10-CM | POA: Diagnosis not present

## 2022-04-26 DIAGNOSIS — M9901 Segmental and somatic dysfunction of cervical region: Secondary | ICD-10-CM | POA: Diagnosis not present

## 2022-04-26 DIAGNOSIS — M9903 Segmental and somatic dysfunction of lumbar region: Secondary | ICD-10-CM | POA: Diagnosis not present

## 2022-05-01 DIAGNOSIS — M4722 Other spondylosis with radiculopathy, cervical region: Secondary | ICD-10-CM | POA: Diagnosis not present

## 2022-05-01 DIAGNOSIS — M9904 Segmental and somatic dysfunction of sacral region: Secondary | ICD-10-CM | POA: Diagnosis not present

## 2022-05-01 DIAGNOSIS — M9903 Segmental and somatic dysfunction of lumbar region: Secondary | ICD-10-CM | POA: Diagnosis not present

## 2022-05-01 DIAGNOSIS — M9901 Segmental and somatic dysfunction of cervical region: Secondary | ICD-10-CM | POA: Diagnosis not present

## 2022-05-03 DIAGNOSIS — M9904 Segmental and somatic dysfunction of sacral region: Secondary | ICD-10-CM | POA: Diagnosis not present

## 2022-05-03 DIAGNOSIS — M4722 Other spondylosis with radiculopathy, cervical region: Secondary | ICD-10-CM | POA: Diagnosis not present

## 2022-05-03 DIAGNOSIS — M9903 Segmental and somatic dysfunction of lumbar region: Secondary | ICD-10-CM | POA: Diagnosis not present

## 2022-05-03 DIAGNOSIS — M9901 Segmental and somatic dysfunction of cervical region: Secondary | ICD-10-CM | POA: Diagnosis not present

## 2022-05-13 DIAGNOSIS — F1120 Opioid dependence, uncomplicated: Secondary | ICD-10-CM | POA: Diagnosis not present

## 2022-05-23 DIAGNOSIS — F1120 Opioid dependence, uncomplicated: Secondary | ICD-10-CM | POA: Diagnosis not present

## 2022-06-10 DIAGNOSIS — F1120 Opioid dependence, uncomplicated: Secondary | ICD-10-CM | POA: Diagnosis not present

## 2022-06-17 DIAGNOSIS — F1120 Opioid dependence, uncomplicated: Secondary | ICD-10-CM | POA: Diagnosis not present

## 2022-07-19 DIAGNOSIS — F1120 Opioid dependence, uncomplicated: Secondary | ICD-10-CM | POA: Diagnosis not present

## 2022-07-22 DIAGNOSIS — F1120 Opioid dependence, uncomplicated: Secondary | ICD-10-CM | POA: Diagnosis not present

## 2022-08-26 DIAGNOSIS — F1120 Opioid dependence, uncomplicated: Secondary | ICD-10-CM | POA: Diagnosis not present

## 2022-09-23 DIAGNOSIS — F1120 Opioid dependence, uncomplicated: Secondary | ICD-10-CM | POA: Diagnosis not present

## 2022-09-24 DIAGNOSIS — F1120 Opioid dependence, uncomplicated: Secondary | ICD-10-CM | POA: Diagnosis not present

## 2022-10-22 DIAGNOSIS — F1120 Opioid dependence, uncomplicated: Secondary | ICD-10-CM | POA: Diagnosis not present

## 2022-10-25 ENCOUNTER — Emergency Department: Payer: 59

## 2022-10-25 ENCOUNTER — Other Ambulatory Visit: Payer: Self-pay

## 2022-10-25 DIAGNOSIS — R0789 Other chest pain: Secondary | ICD-10-CM | POA: Diagnosis not present

## 2022-10-25 LAB — BASIC METABOLIC PANEL
Anion gap: 9 (ref 5–15)
BUN: 29 mg/dL — ABNORMAL HIGH (ref 6–20)
CO2: 25 mmol/L (ref 22–32)
Calcium: 8.9 mg/dL (ref 8.9–10.3)
Chloride: 102 mmol/L (ref 98–111)
Creatinine, Ser: 0.82 mg/dL (ref 0.61–1.24)
GFR, Estimated: >60 mL/min (ref >60)
Glucose, Bld: 91 mg/dL (ref 70–99)
Potassium: 3.8 mmol/L (ref 3.5–5.1)
Sodium: 136 mmol/L (ref 135–145)

## 2022-10-25 LAB — CBC
HCT: 42.6 % (ref 39.0–52.0)
Hemoglobin: 14.5 g/dL (ref 13.0–17.0)
MCH: 30.5 pg (ref 26.0–34.0)
MCHC: 34 g/dL (ref 30.0–36.0)
MCV: 89.5 fL (ref 80.0–100.0)
Platelets: 294 10*3/uL (ref 150–400)
RBC: 4.76 MIL/uL (ref 4.22–5.81)
RDW: 11.6 % (ref 11.5–15.5)
WBC: 6.9 10*3/uL (ref 4.0–10.5)
nRBC: 0 % (ref 0.0–0.2)

## 2022-10-25 LAB — TROPONIN I (HIGH SENSITIVITY): Troponin I (High Sensitivity): 3 ng/L (ref <18)

## 2022-10-25 NOTE — ED Triage Notes (Addendum)
Pt to ED via POV c/o central CP that started this morning. Pain does not radiate, characterized as a "stabbing" pain, and worsens with movement. Endorses SOB with CP.  Denies N/V, fevers, dizziness.

## 2022-10-26 ENCOUNTER — Emergency Department
Admission: EM | Admit: 2022-10-26 | Discharge: 2022-10-26 | Disposition: A | Discharge status: Home / Self Care | Payer: 59 | Attending: Emergency Medicine | Admitting: Emergency Medicine

## 2022-10-26 DIAGNOSIS — R0789 Other chest pain: Secondary | ICD-10-CM

## 2022-10-26 LAB — TROPONIN I (HIGH SENSITIVITY): Troponin I (High Sensitivity): 3 ng/L (ref <18)

## 2022-10-26 MED ORDER — KETOROLAC TROMETHAMINE 30 MG/ML IJ SOLN
INTRAMUSCULAR | Status: AC
  Filled 2022-10-26: qty 1

## 2022-10-26 MED ORDER — KETOROLAC TROMETHAMINE 30 MG/ML IJ SOLN
30.0000 mg | Freq: Once | INTRAMUSCULAR | Status: AC
  Administered 2022-10-26: 30 mg via INTRAMUSCULAR

## 2022-10-26 NOTE — ED Provider Notes (Signed)
[facility] Provider Note    Event Date/Time   First MD Initiated Contact with Patient 10/26/22 0157     (approximate)   History   Chest Pain   HPI  Vernon Hudson is a 42 y.o. male who presents to the ED for evaluation of Chest Pain   Patient without significant medical history presents with about 12 hours of chest pain.  Reports a big workout yesterday, including chest.  Pain when he touches his own chest, pain with movement, pain with deep breath.  Not pain with exertion.  No injuries or trauma or rash.   Physical Exam   Triage Vital Signs: ED Triage Vitals [10/25/22 2156]  Encounter Vitals Group     BP (!) 148/99     Systolic BP Percentile      Diastolic BP Percentile      Pulse Rate 68     Resp 20     Temp 98.5 F (36.9 C)     Temp Source Oral     SpO2 98 %     Weight 155 lb (70.3 kg)     Height 5\' 7"  (1.702 m)     Head Circumference      Peak Flow      Pain Score 9     Pain Loc      Pain Education      Exclude from Growth Chart     Most recent vital signs: Vitals:   10/25/22 2156  BP: (!) 148/99  Pulse: 68  Resp: 20  Temp: 98.5 F (36.9 C)  SpO2: 98%    General: Awake, no distress.  Well-appearing CV:  Good peripheral perfusion.  Resp:  Normal effort.  Abd:  No distention.  MSK:  No deformity noted.  Neuro:  No focal deficits appreciated. Other:  Reproducible chest discomfort on palpation without overlying skin changes or signs of trauma   ED Results / Procedures / Treatments   Labs (all labs ordered are listed, but only abnormal results are displayed) Labs Reviewed  BASIC METABOLIC PANEL - Abnormal; Notable for the following components:      Result Value   BUN 29 (*)    All other components within normal limits  CBC  TROPONIN I (HIGH SENSITIVITY)  TROPONIN I (HIGH SENSITIVITY)    EKG Sinus rhythm with normal rate, rhythm and axis.  No signs of acute ischemia  RADIOLOGY CXR interpreted by me without  evidence of acute cardiopulmonary pathology.  Official radiology report(s): DG Chest 2 View  Result Date: 10/25/2022 CLINICAL DATA:  Chest pain EXAM: CHEST - 2 VIEW COMPARISON:  08/08/2017 FINDINGS: The heart size and mediastinal contours are within normal limits. Both lungs are clear. The visualized skeletal structures are unremarkable. IMPRESSION: No active cardiopulmonary disease. Electronically Signed   By: Jasmine Pang M.D.   On: 10/25/2022 22:36    PROCEDURES and INTERVENTIONS:  Procedures  Medications  ketorolac (TORADOL) 30 MG/ML injection 30 mg (30 mg Intramuscular Given 10/26/22 0222)     IMPRESSION / MDM / ASSESSMENT AND PLAN / ED COURSE  I reviewed the triage vital signs and the nursing notes.  Differential diagnosis includes, but is not limited to, ACS, PTX, PNA, muscle strain/spasm, PE, dissection, anxiety, pleural effusion  {Patient presents with symptoms of an acute illness or injury that is potentially life-threatening.  Patient presents with atypical chest discomfort suitable for outpatient management.  Reproducible on palpation, otherwise normal exam.  Normal blood work with normal CBC,  metabolic panel and first troponin.  As below, we had drawn a second troponin but do not get results due to not work or computer issues.  He is comfortable discharging despite this and I think this is reasonable.  Discussed return precautions.  Clinical Course as of 10/26/22 0223  Fri Oct 26, 2022  0214 Discussed high likelihood of MSK pain.  Of note, we have some network issue in the hospital/that our labs, imaging are down and we would not be able to get a second troponin, even though it has been drawn.  Unknown when results will ever occur.  When discussing this with the patient, he is comfortable going home, we discussed return precautions.  He acknowledges the risks of undiagnosed pathology considering the lack of a second troponin. [DS]    Clinical Course User Index [DS] Delton Prairie, MD     FINAL CLINICAL IMPRESSION(S) / ED DIAGNOSES   Final diagnoses:  Other chest pain     Rx / DC Orders   ED Discharge Orders     None        Note:  This document was prepared using Dragon voice recognition software and may include unintentional dictation errors.   Delton Prairie, MD 10/26/22 [phone]

## 2022-10-26 NOTE — ED Notes (Signed)
Pt ready for discharge at this time. Denies having needs or concerns. Ambulatory at time of discharge.

## 2022-10-26 NOTE — Discharge Instructions (Signed)
Please take Tylenol and ibuprofen/Advil for your pain.  It is safe to take them together, or to alternate them every few hours.  Take up to 1000mg of Tylenol at a time, up to 4 times per day.  Do not take more than 4000 mg of Tylenol in 24 hours.  For ibuprofen, take 400-600 mg, 3 - 4 times per day.  

## 2022-10-30 DIAGNOSIS — F1120 Opioid dependence, uncomplicated: Secondary | ICD-10-CM | POA: Diagnosis not present

## 2022-11-01 ENCOUNTER — Other Ambulatory Visit: Payer: Self-pay

## 2022-11-01 ENCOUNTER — Emergency Department
Admission: EM | Admit: 2022-11-01 | Discharge: 2022-11-01 | Disposition: A | Discharge status: Home / Self Care | Payer: 59 | Attending: Emergency Medicine | Admitting: Emergency Medicine

## 2022-11-01 DIAGNOSIS — T7840XA Allergy, unspecified, initial encounter: Secondary | ICD-10-CM | POA: Diagnosis not present

## 2022-11-01 DIAGNOSIS — R0602 Shortness of breath: Secondary | ICD-10-CM | POA: Diagnosis not present

## 2022-11-01 DIAGNOSIS — I959 Hypotension, unspecified: Secondary | ICD-10-CM | POA: Diagnosis not present

## 2022-11-01 DIAGNOSIS — R0902 Hypoxemia: Secondary | ICD-10-CM | POA: Diagnosis not present

## 2022-11-01 DIAGNOSIS — T782XXA Anaphylactic shock, unspecified, initial encounter: Secondary | ICD-10-CM | POA: Diagnosis not present

## 2022-11-01 DIAGNOSIS — R0689 Other abnormalities of breathing: Secondary | ICD-10-CM | POA: Diagnosis not present

## 2022-11-01 DIAGNOSIS — R001 Bradycardia, unspecified: Secondary | ICD-10-CM | POA: Diagnosis not present

## 2022-11-01 DIAGNOSIS — Z743 Need for continuous supervision: Secondary | ICD-10-CM | POA: Diagnosis not present

## 2022-11-01 MED ORDER — PREDNISONE 20 MG PO TABS: 40.0000 mg | ORAL_TABLET | Freq: Every day | ORAL | 0 refills | Status: DC

## 2022-11-01 MED ORDER — EPINEPHRINE 0.3 MG/0.3ML IJ SOAJ: 0.3000 mg | INTRAMUSCULAR | 3 refills | Status: DC | PRN

## 2022-11-01 MED ORDER — EPINEPHRINE 0.3 MG/0.3ML IJ SOAJ: 0.3000 mg | INTRAMUSCULAR | 3 refills | Status: AC | PRN

## 2022-11-01 MED ORDER — PREDNISONE 20 MG PO TABS: 40.0000 mg | ORAL_TABLET | Freq: Every day | ORAL | 0 refills | Status: AC

## 2022-11-01 MED ORDER — DIPHENHYDRAMINE HCL 50 MG/ML IJ SOLN
25.0000 mg | INTRAMUSCULAR | Status: AC
  Administered 2022-11-01: 25 mg via INTRAVENOUS
  Filled 2022-11-01: qty 1

## 2022-11-01 NOTE — ED Triage Notes (Signed)
Pt also received Benadryl at the Urgent Care

## 2022-11-01 NOTE — ED Provider Notes (Signed)
[facility] Provider Note    Event Date/Time   First MD Initiated Contact with Patient 11/01/22 1529     (approximate)   History   Chief Complaint: Allergic Reaction (Pt came in via ems from [facility] urgent [facility], after being stung by some type of insect that came out of the ground. Pt had EPI AT 1450, and another dose @1507 . Pt also had gotten 20mg  Famotidine, Solumerol 125, 100cc of IVF. )   HPI  Vernon Hudson is a 42 y.o. male with no significant past medical history who was in his usual state of health, got stung by a bee or wasp today and then had rapid onset of difficulty breathing, feeling of throat swelling, and hives.  EMS report initial blood pressure was 80/30.  They gave epinephrine along with Benadryl Pepcid and Solu-Medrol.  Patient reports on arrival the throat feels little bit scratchy but otherwise he feels normal.     Physical Exam   Triage Vital Signs: ED Triage Vitals [11/01/22 1529]  Encounter Vitals Group     BP (!) 157/90     Systolic BP Percentile      Diastolic BP Percentile      Pulse Rate 76     Resp 16     Temp      Temp src      SpO2 100 %     Weight      Height      Head Circumference      Peak Flow      Pain Score      Pain Loc      Pain Education      Exclude from Growth Chart     Most recent vital signs: Vitals:   11/01/22 1529 11/01/22 1531  BP: (!) 157/90   Pulse: 76   Resp: 16   Temp:  97.9 F (36.6 C)  SpO2: 100%     General: Awake, no distress.  CV:  Good peripheral perfusion.  Regular rate rhythm.  Normal distal pulses Resp:  Normal effort.  Clear to auscultation bilaterally, no wheezing or stridor Abd:  No distention.  Soft nontender Other:  Mild erythema over the upper chest.  Sting location on the posterior left upper arm shows 3 cm diameter area of erythema.  There is no embedded stinger. Oropharynx is clear, no tongue swelling or elevation.   ED Results / Procedures / Treatments    Labs (all labs ordered are listed, but only abnormal results are displayed) Labs Reviewed - No data to display   EKG Interpreted by me Sinus rhythm rate of 76.  Normal axis, normal intervals.  Normal QRS ST segments and T waves.   RADIOLOGY    PROCEDURES:  Procedures   MEDICATIONS ORDERED IN ED: Medications  diphenhydrAMINE (BENADRYL) injection 25 mg (25 mg Intravenous Given 11/01/22 1533)     IMPRESSION / MDM / ASSESSMENT AND PLAN / ED COURSE  I reviewed the triage vital signs and the nursing notes.  Patient's presentation is most consistent with acute presentation with potential threat to life or bodily function.  Patient brought to the ED after anaphylactic reaction to an insect sting.  Hypotension is indicative of anaphylactic shock.  He was treated successfully with epinephrine Benadryl Solu-Medrol on scene.  Will observe him in the ED to ensure he returns to normal with no relapsing symptoms.  ----------------------------------------- 4:52 PM on 11/01/2022 ----------------------------------------- Patient continues to feel normal.  Lungs clear to  auscultation without wheezing or stridor.  No oropharyngeal swelling.  No remaining urticaria.  Wishes to be discharged due to his birthday party this evening which I think is reasonable at this point.  Return precautions discussed.  Prednisone and MD prescription sent.      FINAL CLINICAL IMPRESSION(S) / ED DIAGNOSES   Final diagnoses:  Anaphylaxis, initial encounter     Rx / DC Orders   ED Discharge Orders          Ordered    predniSONE (DELTASONE) 20 MG tablet  Daily with breakfast,   Status:  Discontinued        11/01/22 1530    EPINEPHrine 0.3 mg/0.3 mL IJ SOAJ injection  As needed,   Status:  Discontinued        11/01/22 1530    EPINEPHrine 0.3 mg/0.3 mL IJ SOAJ injection  As needed        11/01/22 1651    predniSONE (DELTASONE) 20 MG tablet  Daily with breakfast        11/01/22 1651              Note:  This document was prepared using Dragon voice recognition software and may include unintentional dictation errors.   Sharman Cheek, MD 11/01/22 [phone]

## 2022-11-01 NOTE — ED Notes (Signed)
Pt A&O x4, no obvious distress noted, respirations regular/unlabored. Pt verbalizes understanding of discharge instructions. Pt able to ambulate from ED independently.   

## 2022-11-01 NOTE — ED Triage Notes (Signed)
Pt came in via ems from [facility] urgent [facility], after being stung by some type of insect that came out of the ground. Pt had EPI AT 1450, and another dose @1507 . Pt also had gotten 20mg  Famotidine, Solumerol 125, 100cc of IVF.

## 2022-11-14 DIAGNOSIS — F1120 Opioid dependence, uncomplicated: Secondary | ICD-10-CM | POA: Diagnosis not present

## 2022-11-19 DIAGNOSIS — F1120 Opioid dependence, uncomplicated: Secondary | ICD-10-CM | POA: Diagnosis not present

## 2022-12-16 DIAGNOSIS — F1120 Opioid dependence, uncomplicated: Secondary | ICD-10-CM | POA: Diagnosis not present

## 2022-12-20 DIAGNOSIS — F1120 Opioid dependence, uncomplicated: Secondary | ICD-10-CM | POA: Diagnosis not present

## 2023-01-07 DIAGNOSIS — F1120 Opioid dependence, uncomplicated: Secondary | ICD-10-CM | POA: Diagnosis not present

## 2023-01-13 DIAGNOSIS — F1120 Opioid dependence, uncomplicated: Secondary | ICD-10-CM | POA: Diagnosis not present

## 2023-02-07 DIAGNOSIS — F1120 Opioid dependence, uncomplicated: Secondary | ICD-10-CM | POA: Diagnosis not present

## 2023-02-10 DIAGNOSIS — F1120 Opioid dependence, uncomplicated: Secondary | ICD-10-CM | POA: Diagnosis not present

## 2023-03-13 DIAGNOSIS — F1120 Opioid dependence, uncomplicated: Secondary | ICD-10-CM | POA: Diagnosis not present

## 2023-03-24 DIAGNOSIS — F1120 Opioid dependence, uncomplicated: Secondary | ICD-10-CM | POA: Diagnosis not present

## 2023-04-08 DIAGNOSIS — F1120 Opioid dependence, uncomplicated: Secondary | ICD-10-CM | POA: Diagnosis not present

## 2023-04-09 DIAGNOSIS — F1120 Opioid dependence, uncomplicated: Secondary | ICD-10-CM | POA: Diagnosis not present

## 2023-04-21 DIAGNOSIS — F1120 Opioid dependence, uncomplicated: Secondary | ICD-10-CM | POA: Diagnosis not present

## 2023-05-05 DIAGNOSIS — F1120 Opioid dependence, uncomplicated: Secondary | ICD-10-CM | POA: Diagnosis not present

## 2023-05-10 DIAGNOSIS — F1120 Opioid dependence, uncomplicated: Secondary | ICD-10-CM | POA: Diagnosis not present

## 2023-06-02 DIAGNOSIS — F1120 Opioid dependence, uncomplicated: Secondary | ICD-10-CM | POA: Diagnosis not present

## 2023-06-07 DIAGNOSIS — F1120 Opioid dependence, uncomplicated: Secondary | ICD-10-CM | POA: Diagnosis not present

## 2023-06-23 DIAGNOSIS — F1120 Opioid dependence, uncomplicated: Secondary | ICD-10-CM | POA: Diagnosis not present

## 2023-07-03 DIAGNOSIS — F1120 Opioid dependence, uncomplicated: Secondary | ICD-10-CM | POA: Diagnosis not present

## 2023-07-08 DIAGNOSIS — F1120 Opioid dependence, uncomplicated: Secondary | ICD-10-CM | POA: Diagnosis not present

## 2023-07-28 DIAGNOSIS — F1120 Opioid dependence, uncomplicated: Secondary | ICD-10-CM | POA: Diagnosis not present

## 2023-08-07 DIAGNOSIS — F1120 Opioid dependence, uncomplicated: Secondary | ICD-10-CM | POA: Diagnosis not present

## 2023-08-21 DIAGNOSIS — F1120 Opioid dependence, uncomplicated: Secondary | ICD-10-CM | POA: Diagnosis not present

## 2023-08-25 DIAGNOSIS — F1120 Opioid dependence, uncomplicated: Secondary | ICD-10-CM | POA: Diagnosis not present

## 2023-09-07 DIAGNOSIS — F1120 Opioid dependence, uncomplicated: Secondary | ICD-10-CM | POA: Diagnosis not present

## 2023-09-18 DIAGNOSIS — F1120 Opioid dependence, uncomplicated: Secondary | ICD-10-CM | POA: Diagnosis not present

## 2023-09-22 DIAGNOSIS — F1120 Opioid dependence, uncomplicated: Secondary | ICD-10-CM | POA: Diagnosis not present

## 2023-10-07 DIAGNOSIS — F1120 Opioid dependence, uncomplicated: Secondary | ICD-10-CM | POA: Diagnosis not present

## 2023-10-23 DIAGNOSIS — F1120 Opioid dependence, uncomplicated: Secondary | ICD-10-CM | POA: Diagnosis not present

## 2023-10-29 DIAGNOSIS — F1120 Opioid dependence, uncomplicated: Secondary | ICD-10-CM | POA: Diagnosis not present

## 2023-11-07 DIAGNOSIS — F1120 Opioid dependence, uncomplicated: Secondary | ICD-10-CM | POA: Diagnosis not present

## 2023-11-26 DIAGNOSIS — F1120 Opioid dependence, uncomplicated: Secondary | ICD-10-CM | POA: Diagnosis not present

## 2023-12-08 DIAGNOSIS — F1120 Opioid dependence, uncomplicated: Secondary | ICD-10-CM | POA: Diagnosis not present

## 2023-12-08 DIAGNOSIS — R7989 Other specified abnormal findings of blood chemistry: Secondary | ICD-10-CM | POA: Diagnosis not present

## 2023-12-29 DIAGNOSIS — F1120 Opioid dependence, uncomplicated: Secondary | ICD-10-CM | POA: Diagnosis not present

## 2024-01-07 DIAGNOSIS — F1120 Opioid dependence, uncomplicated: Secondary | ICD-10-CM | POA: Diagnosis not present
